# Patient Record
Sex: Female | Born: 1980 | Race: White | Hispanic: No | Marital: Single | Smoking: Former smoker
Health system: Southern US, Community
[De-identification: ages and names within clinical notes are randomized; demographics above are authoritative.]

## PROBLEM LIST (undated history)

## (undated) DIAGNOSIS — F909 Attention-deficit hyperactivity disorder, unspecified type: Secondary | ICD-10-CM

## (undated) DIAGNOSIS — Z95828 Presence of other vascular implants and grafts: Secondary | ICD-10-CM

## (undated) DIAGNOSIS — I38 Endocarditis, valve unspecified: Secondary | ICD-10-CM

---

## 2013-10-06 ENCOUNTER — Ambulatory Visit: Payer: Self-pay | Admitting: Family Medicine

## 2013-11-07 ENCOUNTER — Ambulatory Visit: Payer: Self-pay | Admitting: Internal Medicine

## 2014-01-01 ENCOUNTER — Emergency Department: Payer: Self-pay | Admitting: Emergency Medicine

## 2014-02-11 ENCOUNTER — Ambulatory Visit: Payer: Self-pay | Admitting: Physician Assistant

## 2014-04-05 ENCOUNTER — Ambulatory Visit: Payer: Self-pay | Admitting: Physician Assistant

## 2014-04-05 LAB — RAPID STREP-A WITH REFLX: Micro Text Report: POSITIVE

## 2014-04-15 ENCOUNTER — Ambulatory Visit: Payer: Self-pay | Admitting: Physician Assistant

## 2014-07-01 ENCOUNTER — Ambulatory Visit
Admit: 2014-07-01 | Disposition: A | Discharge status: Home / Self Care | Payer: Self-pay | Attending: Family Medicine | Admitting: Family Medicine

## 2015-05-08 ENCOUNTER — Ambulatory Visit
Admission: EM | Admit: 2015-05-08 | Discharge: 2015-05-08 | Disposition: A | Discharge status: Home / Self Care | Payer: Medicaid Other | Attending: Family Medicine | Admitting: Family Medicine

## 2015-05-08 DIAGNOSIS — J069 Acute upper respiratory infection, unspecified: Secondary | ICD-10-CM | POA: Diagnosis not present

## 2015-05-08 DIAGNOSIS — B349 Viral infection, unspecified: Secondary | ICD-10-CM

## 2015-05-08 DIAGNOSIS — J9801 Acute bronchospasm: Secondary | ICD-10-CM

## 2015-05-08 DIAGNOSIS — J0110 Acute frontal sinusitis, unspecified: Secondary | ICD-10-CM | POA: Diagnosis not present

## 2015-05-08 DIAGNOSIS — B9789 Other viral agents as the cause of diseases classified elsewhere: Secondary | ICD-10-CM

## 2015-05-08 HISTORY — DX: Attention-deficit hyperactivity disorder, unspecified type: F90.9

## 2015-05-08 MED ORDER — AMOXICILLIN-POT CLAVULANATE 875-125 MG PO TABS: 1.0000 | ORAL_TABLET | Freq: Two times a day (BID) | ORAL | Status: DC

## 2015-05-08 MED ORDER — ALBUTEROL SULFATE HFA 108 (90 BASE) MCG/ACT IN AERS: 2.0000 | INHALATION_SPRAY | RESPIRATORY_TRACT | Status: DC | PRN

## 2015-05-08 NOTE — ED Notes (Signed)
Silver colored ring with clear stones left in patient room. Attempted to call patient but telephone number obtained on arrival to [facility] did not work. Ring given to [employer] and he placed ring in safe

## 2015-05-08 NOTE — ED Provider Notes (Signed)
CSN: [id number]     Arrival date & time 05/08/15  1915 History   First MD Initiated Contact with Patient 05/08/15 1946     No chief complaint on file.  (Consider location/radiation/quality/duration/timing/severity/associated sxs/prior Treatment) HPI   Reports symptoms started about 2 weeks ago with allergy symptoms with watery eyes and clear rhinorrhea with mild cough. Gradual improvement over a week with OTC cetirizine. She thought that she caught a respiratory illness from her daughter. Describes worsening of symptoms about 4 days ago (after previously improved), symptoms with cough (productive with thick white mucus) and some wheezing. Unable to get in to see PCP today due to no sick visits available. - Admits to generalized body aches, primarily in chest wall / ribcage, worse following coughing - Admits to occasional headaches with sinus congestion and pressure, sinus congestion - Admits difficulty sleeping due to cough - Tried Tylenol and Advil for some sore throat and aches - Tolerating food and drink well, trying to stay hydrated - Received flu shot this season - Denies fevers/chills, ear pain, sore throat, nausea, vomiting, abdominal pain, chest pain or pressure, shortness of breath  No prior diagnosis of asthma. Usually endorses wheezing when gets sick. Also has second hand smoke exposure. Patient is former smoker quit >1 year ago  Past Medical History  Diagnosis Date  . ADHD (attention deficit hyperactivity disorder)    Past Surgical History  Procedure Laterality Date  . Cesarean section     History reviewed. Father and Sister with asthma. Social History  Substance Use Topics  . Smoking status: Former Games developer  . Smokeless tobacco: None  . Alcohol Use: No   OB History    No data available     Review of Systems  See above HPI  Allergies  Review of patient's allergies indicates no known allergies.  Home Medications   Prior to Admission medications   Medication Sig  Start Date End Date Taking? Authorizing Provider  amphetamine-dextroamphetamine (ADDERALL) 20 MG tablet Take 20 mg by mouth 3 (three) times daily.   Yes Historical Provider, MD  albuterol (PROVENTIL HFA;VENTOLIN HFA) 108 (90 Base) MCG/ACT inhaler Inhale 2 puffs into the lungs every 4 (four) hours as needed for wheezing or shortness of breath (cough). 05/08/15   Smitty Cords, DO  amoxicillin-clavulanate (AUGMENTIN) 875-125 MG tablet Take 1 tablet by mouth 2 (two) times daily. Only fill if worsening over weekend. 05/08/15   Smitty Cords, DO   Meds Ordered and Administered this Visit  Medications - No data to display  BP 113/74 mmHg  Pulse 94  Temp(Src) 98.4 F (36.9 C) (Oral)  Resp 18  SpO2 97%  LMP 04/26/2015 (Approximate) No data found.   Physical Exam  Constitutional: She is oriented to person, place, and time. She appears well-developed and well-nourished. No distress.  Well-appearing, comfortable, cooperative, conversational  HENT:  Head: Normocephalic and atraumatic.  Mouth/Throat: Oropharynx is clear and moist.  Frontal sinus tenderness bilaterally, nares patent without nasal purulence, bilateral TM's grey and clear without erythema, effusion or bulging. Oropharynx clear without erythema, edema, or asymmetry  Eyes: Conjunctivae and EOM are normal. Pupils are equal, round, and reactive to light. Right eye exhibits no discharge. Left eye exhibits no discharge.  Neck: Normal range of motion. Neck supple. No thyromegaly present.  Cardiovascular: Normal rate, regular rhythm, normal heart sounds and intact distal pulses.   No murmur heard. Pulmonary/Chest: Effort normal. No respiratory distress. She has wheezes (bilateral expiratory wheezes). She has no rales. She exhibits  no tenderness.  Good air movement. Speaks full sentences.  Musculoskeletal: She exhibits no edema or tenderness (extremities and chest wall).  Lymphadenopathy:    She has no cervical adenopathy.   Neurological: She is alert and oriented to person, place, and time.  Skin: Skin is warm and dry. No rash noted. She is not diaphoretic.  Psychiatric: She has a normal mood and affect.  Nursing note and vitals reviewed.   ED Course  Procedures (including critical care time)  Labs Review Labs Reviewed - No data to display  Imaging Review No results found.    MDM   1. Acute frontal sinusitis, recurrence not specified   2. Viral URI with cough   3. Acute bronchospasm due to viral infection    35 yr Female former smoker with constellation of viral URI symptoms x 2 weeks, initially allergic symptoms improved then worsened 4 days ago to more sinusitis vs URI with productive cough and wheezing. No formal dx asthma (similar prior course). Clinically well-appearing, afebrile, exp wheezing on exam, otherwise no focal findings of infection. Some frontal sinus tenderness but no purulence.  Suspect acute rhinosinusitis, likely initially viral URI vs allergic etiology with improvement now with persistent sinus congestion without evidence of acute bacterial infection.   Plan: 1. Reassurance, likely self-limited - however given upcoming weekend and overall >2 weeks of illness with worsening now, mutual decision to provide empiric antibiotic if patient worsens over weekend, with Augmentin 875-125mg  PO BID x 10 days only fill if worsening 2. Start Albuterol inhaler 2 puffs q 4-6 hr regularly for 2-3 days then PRN 3. Continue Zyrtec OTC daily - likely allergic component 4. Supportive care with nasal saline OTC, hydration, Mucinex-DM 5. Work note out 2/9 and 2/10, return Monday 2/13 6. Return criteria reviewed, close follow-up with PCP within 1 week if not improving despite antibiotic    Smitty Cords, DO 05/08/15 2034
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
## 2015-05-08 NOTE — ED Provider Notes (Signed)
CSN: [id number]     Arrival date & time 05/08/15  1915 History   First MD Initiated Contact with Patient 05/08/15 1946     No chief complaint on file.  (Consider location/radiation/quality/duration/timing/severity/associated sxs/prior Treatment) HPI: Reviewed history with Dr. Kirtland Bouchard. Patient does have mild cough, wheezing, sinus congestion, clear to white colored mucus. Patient denies any high fevers, chest pain, nausea, vomiting, diarrhea, abdominal pain. She has used the inhaler in the past for upper respiratory infections. She is not smoke but is a former smoker.  Past Medical History  Diagnosis Date  . ADHD (attention deficit hyperactivity disorder)    Past Surgical History  Procedure Laterality Date  . Cesarean section     History reviewed. No pertinent family history. Social History  Substance Use Topics  . Smoking status: Former Games developer  . Smokeless tobacco: None  . Alcohol Use: No   OB History    No data available     Review of Systems: Negative except mentioned above.  Allergies  Review of patient's allergies indicates no known allergies.  Home Medications   Prior to Admission medications   Medication Sig Start Date End Date Taking? Authorizing Provider  amphetamine-dextroamphetamine (ADDERALL) 20 MG tablet Take 20 mg by mouth 3 (three) times daily.   Yes Historical Provider, MD   Meds Ordered and Administered this Visit  Medications - No data to display  BP 113/74 mmHg  Pulse 94  Temp(Src) 98.4 F (36.9 C) (Oral)  Resp 18  SpO2 97%  LMP 04/26/2015 (Approximate) No data found.   Physical Exam:  GENERAL: NAD HEENT: mild pharyngeal erythema, no exudate, no erythema of TMs, no cervical LAD RESP: mild expiratory wheezes, no retractions or accessory muscle use  CARD: RRR NEURO: CN II-XII grossly intact   ED Course  Procedures (including critical care time)  Labs Review Labs Reviewed - No data to display  Imaging Review No results found.       MDM   A/P: URI/Sinusitis-did review assessment and plan with Dr. Kirtland Bouchard. I agree with his plan. Patient will start antibiotic a few days if needed. She is to use her Albuterol Inhaler. She is to use other over-the-counter medications to treat her symptoms. If her symptoms do persist or worsen I do recommend that she seek medical attention as discussed. Patient was given a work excuse for 2 days.   Jolene Provost, MD 05/08/15 2023

## 2015-05-08 NOTE — ED Notes (Signed)
Non-toxic appearing pt.  Wearing mask upon arrival to treatment room.

## 2015-05-08 NOTE — Discharge Instructions (Signed)
1. It sounds like you have persistent Sinus Congestion or "Rhinosinusitis" - I do not think that this is a Bacterial Sinus Infection. Usually these are caused by Viruses or Allergies, and will run it's course in about 7 to 10 days. - We do not think you need to start an antibiotic today, however, if your symptoms don't improve it may be a bacterial infection, if worsening over weekend, start Augmentin 1 pill twice daily for total 10 days - Recommend to start keep using Nasal Saline spray multiple times a day to help flush out congestion and clear sinuses - Improve hydration by drinking plenty of clear fluids (water, gatorade) to reduce secretions and thin congestion - You are wheezing, recommend using Albuterol inhaler 2 puffs every 4 to 6 hours (use regularly for next 2-3 days then switch to ONLY AS NEEDED) - Continue Zyrtec (once daily) over the counter for about 1 month trial - if helping can continue or resume during allergy season - Congestion draining down throat can cause irritation. May try warm herbal tea with honey, cough drops - Can take Tylenol or Ibuprofen as needed for fevers - May try Mucinex-DM for cough twice daily for up to 5 to 7 days, then stop  If you develop persistent fever >101F for at least 3 consecutive days, headaches with sinus pain or pressure or persistent earache, recommend start antibiotic, and follow-up with your primary doctor within a week
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
## 2015-12-28 ENCOUNTER — Emergency Department: Payer: No Typology Code available for payment source

## 2015-12-28 ENCOUNTER — Emergency Department
Admission: EM | Admit: 2015-12-28 | Discharge: 2015-12-28 | Disposition: A | Discharge status: Home / Self Care | Payer: No Typology Code available for payment source | Attending: Emergency Medicine | Admitting: Emergency Medicine

## 2015-12-28 DIAGNOSIS — Z79899 Other long term (current) drug therapy: Secondary | ICD-10-CM | POA: Insufficient documentation

## 2015-12-28 DIAGNOSIS — J189 Pneumonia, unspecified organism: Secondary | ICD-10-CM | POA: Diagnosis not present

## 2015-12-28 DIAGNOSIS — S27329A Contusion of lung, unspecified, initial encounter: Secondary | ICD-10-CM

## 2015-12-28 DIAGNOSIS — Y999 Unspecified external cause status: Secondary | ICD-10-CM | POA: Diagnosis not present

## 2015-12-28 DIAGNOSIS — N390 Urinary tract infection, site not specified: Secondary | ICD-10-CM | POA: Insufficient documentation

## 2015-12-28 DIAGNOSIS — Y92410 Unspecified street and highway as the place of occurrence of the external cause: Secondary | ICD-10-CM | POA: Insufficient documentation

## 2015-12-28 DIAGNOSIS — Y9389 Activity, other specified: Secondary | ICD-10-CM | POA: Insufficient documentation

## 2015-12-28 DIAGNOSIS — F909 Attention-deficit hyperactivity disorder, unspecified type: Secondary | ICD-10-CM | POA: Insufficient documentation

## 2015-12-28 DIAGNOSIS — Z87891 Personal history of nicotine dependence: Secondary | ICD-10-CM | POA: Insufficient documentation

## 2015-12-28 DIAGNOSIS — S299XXA Unspecified injury of thorax, initial encounter: Secondary | ICD-10-CM | POA: Diagnosis present

## 2015-12-28 LAB — URINALYSIS COMPLETE WITH MICROSCOPIC (ARMC ONLY)
Bilirubin Urine: NEGATIVE
GLUCOSE, UA: NEGATIVE mg/dL
Ketones, ur: NEGATIVE mg/dL
Nitrite: POSITIVE — AB
PROTEIN: 100 mg/dL — AB
SPECIFIC GRAVITY, URINE: 1.013 (ref 1.005–1.030)
pH: 5 (ref 5.0–8.0)

## 2015-12-28 LAB — COMPREHENSIVE METABOLIC PANEL
ALBUMIN: 3 g/dL — AB (ref 3.5–5.0)
ALK PHOS: 224 U/L — AB (ref 38–126)
ALT: 39 U/L (ref 14–54)
AST: 41 U/L (ref 15–41)
Anion gap: 10 (ref 5–15)
BUN: 21 mg/dL — ABNORMAL HIGH (ref 6–20)
CALCIUM: 8.8 mg/dL — AB (ref 8.9–10.3)
CHLORIDE: 96 mmol/L — AB (ref 101–111)
CO2: 25 mmol/L (ref 22–32)
CREATININE: 1.09 mg/dL — AB (ref 0.44–1.00)
GFR calc Af Amer: >60 mL/min (ref >60)
GFR calc non Af Amer: >60 mL/min (ref >60)
GLUCOSE: 155 mg/dL — AB (ref 65–99)
Potassium: 2.7 mmol/L — CL (ref 3.5–5.1)
SODIUM: 131 mmol/L — AB (ref 135–145)
Total Bilirubin: 1.5 mg/dL — ABNORMAL HIGH (ref 0.3–1.2)
Total Protein: 7.1 g/dL (ref 6.5–8.1)

## 2015-12-28 LAB — CBC WITH DIFFERENTIAL/PLATELET
BASOS ABS: 0 10*3/uL (ref 0–0.1)
BASOS PCT: 0 %
EOS ABS: 0 10*3/uL (ref 0–0.7)
Eosinophils Relative: 0 %
HCT: 35.6 % (ref 35.0–47.0)
HEMOGLOBIN: 12.1 g/dL (ref 12.0–16.0)
Lymphocytes Relative: 7 %
Lymphs Abs: 1.2 10*3/uL (ref 1.0–3.6)
MCH: 28.7 pg (ref 26.0–34.0)
MCHC: 34.1 g/dL (ref 32.0–36.0)
MCV: 84.2 fL (ref 80.0–100.0)
Monocytes Absolute: 0.7 10*3/uL (ref 0.2–0.9)
Monocytes Relative: 4 %
NEUTROS PCT: 89 %
Neutro Abs: 16 10*3/uL — ABNORMAL HIGH (ref 1.4–6.5)
PLATELETS: 125 10*3/uL — AB (ref 150–440)
RBC: 4.22 MIL/uL (ref 3.80–5.20)
RDW: 12.4 % (ref 11.5–14.5)
WBC: 17.9 10*3/uL — AB (ref 3.6–11.0)

## 2015-12-28 LAB — LACTIC ACID, PLASMA: LACTIC ACID, VENOUS: 1.3 mmol/L (ref 0.5–1.9)

## 2015-12-28 LAB — TROPONIN I

## 2015-12-28 LAB — POCT PREGNANCY, URINE: Preg Test, Ur: NEGATIVE

## 2015-12-28 MED ORDER — LEVOFLOXACIN 750 MG PO TABS: 750.0000 mg | ORAL_TABLET | Freq: Every day | ORAL | 0 refills | Status: DC

## 2015-12-28 MED ORDER — LEVOFLOXACIN IN D5W 750 MG/150ML IV SOLN
750.0000 mg | Freq: Once | INTRAVENOUS | Status: AC
  Administered 2015-12-28: 750 mg via INTRAVENOUS
  Filled 2015-12-28: qty 150

## 2015-12-28 MED ORDER — POTASSIUM CHLORIDE CRYS ER 20 MEQ PO TBCR
40.0000 meq | EXTENDED_RELEASE_TABLET | Freq: Once | ORAL | Status: AC
  Administered 2015-12-28: 40 meq via ORAL
  Filled 2015-12-28 (×2): qty 2

## 2015-12-28 MED ORDER — ACETAMINOPHEN 325 MG PO TABS
650.0000 mg | ORAL_TABLET | Freq: Once | ORAL | Status: AC
  Administered 2015-12-28: 650 mg via ORAL

## 2015-12-28 MED ORDER — SODIUM CHLORIDE 0.9 % IV SOLN
1000.0000 mL | Freq: Once | INTRAVENOUS | Status: AC
  Administered 2015-12-28: 1000 mL via INTRAVENOUS

## 2015-12-28 MED ORDER — ACETAMINOPHEN 325 MG PO TABS
ORAL_TABLET | ORAL | Status: AC
  Filled 2015-12-28: qty 2

## 2015-12-28 NOTE — ED Triage Notes (Signed)
States just got of jail for probation violation so this is first chance to get checked out
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
## 2015-12-29 ENCOUNTER — Encounter: Payer: Self-pay | Admitting: Emergency Medicine

## 2015-12-29 ENCOUNTER — Inpatient Hospital Stay
Admission: EM | Admit: 2015-12-29 | Discharge: 2016-01-02 | DRG: 871 | Disposition: A | Discharge status: Home Health | Payer: Medicaid Other | Attending: Internal Medicine | Admitting: Internal Medicine

## 2015-12-29 ENCOUNTER — Inpatient Hospital Stay (HOSPITAL_COMMUNITY)
Admit: 2015-12-29 | Discharge: 2015-12-29 | Disposition: A | Discharge status: Home / Self Care | Payer: Medicaid Other | Attending: Internal Medicine | Admitting: Internal Medicine

## 2015-12-29 DIAGNOSIS — A4101 Sepsis due to Methicillin susceptible Staphylococcus aureus: Principal | ICD-10-CM | POA: Diagnosis present

## 2015-12-29 DIAGNOSIS — N3000 Acute cystitis without hematuria: Secondary | ICD-10-CM | POA: Diagnosis present

## 2015-12-29 DIAGNOSIS — I330 Acute and subacute infective endocarditis: Secondary | ICD-10-CM | POA: Diagnosis present

## 2015-12-29 DIAGNOSIS — R7881 Bacteremia: Secondary | ICD-10-CM

## 2015-12-29 DIAGNOSIS — L89152 Pressure ulcer of sacral region, stage 2: Secondary | ICD-10-CM | POA: Diagnosis present

## 2015-12-29 DIAGNOSIS — F909 Attention-deficit hyperactivity disorder, unspecified type: Secondary | ICD-10-CM | POA: Diagnosis present

## 2015-12-29 DIAGNOSIS — G8929 Other chronic pain: Secondary | ICD-10-CM | POA: Diagnosis present

## 2015-12-29 DIAGNOSIS — J189 Pneumonia, unspecified organism: Secondary | ICD-10-CM | POA: Diagnosis present

## 2015-12-29 DIAGNOSIS — Z87891 Personal history of nicotine dependence: Secondary | ICD-10-CM

## 2015-12-29 DIAGNOSIS — E876 Hypokalemia: Secondary | ICD-10-CM | POA: Diagnosis present

## 2015-12-29 DIAGNOSIS — A419 Sepsis, unspecified organism: Secondary | ICD-10-CM | POA: Diagnosis present

## 2015-12-29 DIAGNOSIS — Z79899 Other long term (current) drug therapy: Secondary | ICD-10-CM | POA: Diagnosis not present

## 2015-12-29 DIAGNOSIS — Z452 Encounter for adjustment and management of vascular access device: Secondary | ICD-10-CM

## 2015-12-29 DIAGNOSIS — M869 Osteomyelitis, unspecified: Secondary | ICD-10-CM | POA: Diagnosis present

## 2015-12-29 DIAGNOSIS — M898X1 Other specified disorders of bone, shoulder: Secondary | ICD-10-CM

## 2015-12-29 DIAGNOSIS — I2690 Septic pulmonary embolism without acute cor pulmonale: Secondary | ICD-10-CM | POA: Diagnosis present

## 2015-12-29 DIAGNOSIS — R0789 Other chest pain: Secondary | ICD-10-CM

## 2015-12-29 DIAGNOSIS — Z825 Family history of asthma and other chronic lower respiratory diseases: Secondary | ICD-10-CM | POA: Diagnosis not present

## 2015-12-29 DIAGNOSIS — E860 Dehydration: Secondary | ICD-10-CM | POA: Diagnosis present

## 2015-12-29 DIAGNOSIS — L8990 Pressure ulcer of unspecified site, unspecified stage: Secondary | ICD-10-CM | POA: Insufficient documentation

## 2015-12-29 DIAGNOSIS — R079 Chest pain, unspecified: Secondary | ICD-10-CM

## 2015-12-29 LAB — ECHOCARDIOGRAM COMPLETE
HEIGHTINCHES: 64 in
WEIGHTICAEL: 2720 [oz_av]

## 2015-12-29 LAB — CBC WITH DIFFERENTIAL/PLATELET
BASOS PCT: 1 %
Basophils Absolute: 0.1 10*3/uL (ref 0–0.1)
EOS ABS: 0.3 10*3/uL (ref 0–0.7)
EOS PCT: 2 %
HCT: 32.7 % — ABNORMAL LOW (ref 35.0–47.0)
HEMOGLOBIN: 11 g/dL — AB (ref 12.0–16.0)
LYMPHS ABS: 2 10*3/uL (ref 1.0–3.6)
Lymphocytes Relative: 12 %
MCH: 28.7 pg (ref 26.0–34.0)
MCHC: 33.6 g/dL (ref 32.0–36.0)
MCV: 85.4 fL (ref 80.0–100.0)
MONO ABS: 0.9 10*3/uL (ref 0.2–0.9)
MONOS PCT: 6 %
NEUTROS PCT: 79 %
Neutro Abs: 13.3 10*3/uL — ABNORMAL HIGH (ref 1.4–6.5)
Platelets: 154 10*3/uL (ref 150–440)
RBC: 3.83 MIL/uL (ref 3.80–5.20)
RDW: 12.4 % (ref 11.5–14.5)
WBC: 16.6 10*3/uL — ABNORMAL HIGH (ref 3.6–11.0)

## 2015-12-29 LAB — BLOOD CULTURE ID PANEL (REFLEXED)
ACINETOBACTER BAUMANNII: NOT DETECTED
CANDIDA ALBICANS: NOT DETECTED
CANDIDA GLABRATA: NOT DETECTED
CANDIDA KRUSEI: NOT DETECTED
CANDIDA PARAPSILOSIS: NOT DETECTED
Candida tropicalis: NOT DETECTED
ESCHERICHIA COLI: NOT DETECTED
Enterobacter cloacae complex: NOT DETECTED
Enterobacteriaceae species: NOT DETECTED
Enterococcus species: NOT DETECTED
Haemophilus influenzae: NOT DETECTED
KLEBSIELLA OXYTOCA: NOT DETECTED
Klebsiella pneumoniae: NOT DETECTED
Listeria monocytogenes: NOT DETECTED
Methicillin resistance: NOT DETECTED
Neisseria meningitidis: NOT DETECTED
PSEUDOMONAS AERUGINOSA: NOT DETECTED
Proteus species: NOT DETECTED
SERRATIA MARCESCENS: NOT DETECTED
STAPHYLOCOCCUS AUREUS BCID: DETECTED — AB
STREPTOCOCCUS PNEUMONIAE: NOT DETECTED
STREPTOCOCCUS PYOGENES: NOT DETECTED
Staphylococcus species: DETECTED — AB
Streptococcus agalactiae: NOT DETECTED
Streptococcus species: NOT DETECTED

## 2015-12-29 LAB — COMPREHENSIVE METABOLIC PANEL
ALBUMIN: 2.5 g/dL — AB (ref 3.5–5.0)
ALK PHOS: 180 U/L — AB (ref 38–126)
ALT: 28 U/L (ref 14–54)
ANION GAP: 12 (ref 5–15)
AST: 24 U/L (ref 15–41)
BUN: 24 mg/dL — ABNORMAL HIGH (ref 6–20)
CALCIUM: 8.6 mg/dL — AB (ref 8.9–10.3)
CO2: 22 mmol/L (ref 22–32)
Chloride: 99 mmol/L — ABNORMAL LOW (ref 101–111)
Creatinine, Ser: 1.04 mg/dL — ABNORMAL HIGH (ref 0.44–1.00)
GFR calc non Af Amer: >60 mL/min (ref >60)
Glucose, Bld: 103 mg/dL — ABNORMAL HIGH (ref 65–99)
POTASSIUM: 3.1 mmol/L — AB (ref 3.5–5.1)
SODIUM: 133 mmol/L — AB (ref 135–145)
TOTAL PROTEIN: 6.3 g/dL — AB (ref 6.5–8.1)
Total Bilirubin: 1.5 mg/dL — ABNORMAL HIGH (ref 0.3–1.2)

## 2015-12-29 LAB — MRSA PCR SCREENING: MRSA by PCR: NEGATIVE

## 2015-12-29 LAB — MAGNESIUM: Magnesium: 2.4 mg/dL (ref 1.7–2.4)

## 2015-12-29 MED ORDER — DEXTROSE 5 % IV SOLN
500.0000 mg | Freq: Once | INTRAVENOUS | Status: AC
  Administered 2015-12-29: 500 mg via INTRAVENOUS
  Filled 2015-12-29: qty 500

## 2015-12-29 MED ORDER — IBUPROFEN 600 MG PO TABS
600.0000 mg | ORAL_TABLET | Freq: Four times a day (QID) | ORAL | Status: DC | PRN
  Administered 2015-12-29 – 2016-01-02 (×13): 600 mg via ORAL
  Filled 2015-12-29 (×14): qty 1

## 2015-12-29 MED ORDER — ACETAMINOPHEN 325 MG PO TABS
650.0000 mg | ORAL_TABLET | Freq: Four times a day (QID) | ORAL | Status: DC | PRN
Start: 2015-12-29 — End: 2016-01-02
  Administered 2015-12-29 – 2016-01-02 (×14): 650 mg via ORAL
  Filled 2015-12-29 (×14): qty 2

## 2015-12-29 MED ORDER — ACETAMINOPHEN 650 MG RE SUPP: 650.0000 mg | Freq: Four times a day (QID) | RECTAL | Status: DC | PRN

## 2015-12-29 MED ORDER — SODIUM CHLORIDE 0.9 % IV BOLUS (SEPSIS)
1000.0000 mL | Freq: Once | INTRAVENOUS | Status: AC
  Administered 2015-12-29: 1000 mL via INTRAVENOUS

## 2015-12-29 MED ORDER — CEFAZOLIN IN D5W 1 GM/50ML IV SOLN
1.0000 g | Freq: Three times a day (TID) | INTRAVENOUS | Status: DC
  Administered 2015-12-30 (×2): 1 g via INTRAVENOUS
  Filled 2015-12-29 (×3): qty 50

## 2015-12-29 MED ORDER — ALBUTEROL SULFATE (2.5 MG/3ML) 0.083% IN NEBU: 3.0000 mL | INHALATION_SOLUTION | RESPIRATORY_TRACT | Status: DC | PRN

## 2015-12-29 MED ORDER — ENOXAPARIN SODIUM 40 MG/0.4ML ~~LOC~~ SOLN
40.0000 mg | SUBCUTANEOUS | Status: DC
  Administered 2015-12-29 – 2016-01-01 (×4): 40 mg via SUBCUTANEOUS
  Filled 2015-12-29 (×4): qty 0.4

## 2015-12-29 MED ORDER — SODIUM CHLORIDE 0.9% FLUSH
3.0000 mL | Freq: Two times a day (BID) | INTRAVENOUS | Status: DC
  Administered 2015-12-29 – 2016-01-02 (×9): 3 mL via INTRAVENOUS

## 2015-12-29 MED ORDER — VANCOMYCIN HCL IN DEXTROSE 1-5 GM/200ML-% IV SOLN
1000.0000 mg | Freq: Once | INTRAVENOUS | Status: AC
  Administered 2015-12-29: 1000 mg via INTRAVENOUS
  Filled 2015-12-29: qty 200

## 2015-12-29 MED ORDER — POTASSIUM CHLORIDE CRYS ER 20 MEQ PO TBCR
40.0000 meq | EXTENDED_RELEASE_TABLET | Freq: Once | ORAL | Status: AC
  Administered 2015-12-29: 40 meq via ORAL
  Filled 2015-12-29: qty 2

## 2015-12-29 MED ORDER — DEXTROSE 5 % IV SOLN
1.0000 g | Freq: Once | INTRAVENOUS | Status: AC
  Administered 2015-12-29: 1 g via INTRAVENOUS
  Filled 2015-12-29: qty 10

## 2015-12-29 NOTE — ED Notes (Signed)
Patient denies pain at IV site, but veins appear more prominent and reddened. Zithromax infusion has been stopped and NS is infusing at this time. Dr. Inocencio HomesGayle aware.

## 2015-12-29 NOTE — ED Triage Notes (Signed)
Pt was seen here yesterday and was discharged to home. Called by this am and was told to return for positive culture results and need IV antibiotics.
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
## 2015-12-29 NOTE — ED Provider Notes (Signed)
-----------------------------------------   4:32 AM on 12/29/2015 -----------------------------------------  Nate from pharmacy called me to let me know that both blood cultures obtained yesterday were positive for staph aureus.  I reviewed Dr. Fransisca ConnorsKinner's note and advised Nate to call the patient tonight and tell her to come back to the emergency department given the probability of bacteremia/septicemia.  He will do so at this time.   Loleta Roseory Lissa Rowles, MD 12/29/15 [phone]
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
## 2015-12-29 NOTE — ED Notes (Signed)
Pt given cup of ice. 

## 2015-12-29 NOTE — Progress Notes (Addendum)
Pharmacy Antibiotic Follow-up Note  Carmen Riley is a 35 y.o. year-old female admitted on 12/28/2015.   Assessment/Plan: Lab called to report GPC in aerobic and anaerobic bottles of 2 sets (4 bottle positive) staph species staphylococcus aureus mecA not detected. Patient was discharged from the ED this afternoon on oral levofloxacin. Spoke with Dr. York CeriseForbach and we decided to reach out to patient now to make her aware of positive blood cultures and to recommend she return to ED for IV treatment. I called the phone numbers in the chart [phone] no answer and not set up to accept voicemails) and ([phone] went straight to voicemail x 2). Left message for her to call back [phone] to speak with me. We will continue to try to reach her.  10/2 0602 additional calls made to home and cell phone. No answer, no message left. Will continue to try to reach her.   10/2 0655 Spoke to [name] and asked her to come back to ED. She had some concerns about child care, but said she would drop her daughter off at 0730 and make her way back to the ED.   Temp (24hrs), Avg:98.1 F (36.7 C), Min:98.1 F (36.7 C), Max:98.1 F (36.7 C)   Recent Labs Lab 12/28/15 1448  WBC 17.9*    Recent Labs Lab 12/28/15 1448  CREATININE 1.09*   CrCl cannot be calculated (Unknown ideal weight.).    No Known Allergies  Thank you for allowing pharmacy to be a part of this patient's care.  Carola FrostNathan A Honesti Seaberg, Pharm.D., BCPS Clinical Pharmacist 12/29/2015 4:36 AM

## 2015-12-30 ENCOUNTER — Inpatient Hospital Stay: Payer: Medicaid Other

## 2015-12-30 DIAGNOSIS — L8990 Pressure ulcer of unspecified site, unspecified stage: Secondary | ICD-10-CM | POA: Insufficient documentation

## 2015-12-30 LAB — SEDIMENTATION RATE: SED RATE: 78 mm/h — AB (ref 0–20)

## 2015-12-30 LAB — BASIC METABOLIC PANEL
Anion gap: 5 (ref 5–15)
BUN: 14 mg/dL (ref 6–20)
CALCIUM: 8.5 mg/dL — AB (ref 8.9–10.3)
CO2: 27 mmol/L (ref 22–32)
Chloride: 106 mmol/L (ref 101–111)
Creatinine, Ser: 0.69 mg/dL (ref 0.44–1.00)
GFR calc Af Amer: >60 mL/min (ref >60)
GLUCOSE: 143 mg/dL — AB (ref 65–99)
Potassium: 3.3 mmol/L — ABNORMAL LOW (ref 3.5–5.1)
Sodium: 138 mmol/L (ref 135–145)

## 2015-12-30 LAB — CBC
HCT: 32.4 % — ABNORMAL LOW (ref 35.0–47.0)
Hemoglobin: 11.2 g/dL — ABNORMAL LOW (ref 12.0–16.0)
MCH: 29.4 pg (ref 26.0–34.0)
MCHC: 34.7 g/dL (ref 32.0–36.0)
MCV: 84.7 fL (ref 80.0–100.0)
Platelets: 194 10*3/uL (ref 150–440)
RBC: 3.82 MIL/uL (ref 3.80–5.20)
RDW: 12.8 % (ref 11.5–14.5)
WBC: 14 10*3/uL — ABNORMAL HIGH (ref 3.6–11.0)

## 2015-12-30 LAB — RAPID HIV SCREEN (HIV 1/2 AB+AG)
HIV 1/2 Antibodies: NONREACTIVE
HIV-1 P24 ANTIGEN - HIV24: NONREACTIVE

## 2015-12-30 LAB — C-REACTIVE PROTEIN: CRP: 32.3 mg/dL — AB (ref <1.0)

## 2015-12-30 MED ORDER — CEFAZOLIN SODIUM-DEXTROSE 2-4 GM/100ML-% IV SOLN
2.0000 g | Freq: Three times a day (TID) | INTRAVENOUS | Status: DC
  Administered 2015-12-30 – 2016-01-02 (×10): 2 g via INTRAVENOUS
  Filled 2015-12-30 (×13): qty 100

## 2015-12-30 MED ORDER — BUPRENORPHINE HCL 2 MG SL SUBL
2.0000 mg | SUBLINGUAL_TABLET | Freq: Every day | SUBLINGUAL | Status: DC
  Administered 2015-12-30 – 2016-01-02 (×4): 2 mg via SUBLINGUAL
  Filled 2015-12-30 (×4): qty 1

## 2015-12-30 MED ORDER — IOPAMIDOL (ISOVUE-370) INJECTION 76%
75.0000 mL | Freq: Once | INTRAVENOUS | Status: AC | PRN
  Administered 2015-12-30: 75 mL via INTRAVENOUS
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
## 2015-12-31 LAB — CULTURE, BLOOD (ROUTINE X 2)

## 2015-12-31 LAB — HEPATITIS C ANTIBODY

## 2015-12-31 NOTE — Progress Notes (Signed)
[facility] CLINIC INFECTIOUS DISEASE PROGRESS NOTE Date of Admission:  12/29/2015     ID: Carmen Riley is a 35 y.o. female with  MSSA bacteremia, septic pulm emboli and probable osteo of sternum Active Problems:   Sepsis (HCC)   Pressure injury of skin   Subjective: Low grade temp, still with chest pain but improving, min dry cough   ROS  Eleven systems are reviewed and negative except per hpi  Medications:  Antibiotics Given (last 72 hours)    Date/Time Action Medication Dose Rate   12/30/15 0603 Given   ceFAZolin (ANCEF) IVPB 1 g/50 mL premix 1 g 100 mL/hr   12/30/15 1329 Given   ceFAZolin (ANCEF) IVPB 1 g/50 mL premix 1 g 100 mL/hr   12/30/15 2031 Given   ceFAZolin (ANCEF) IVPB 2g/100 mL premix 2 g 200 mL/hr   12/31/15 0502 Given   ceFAZolin (ANCEF) IVPB 2g/100 mL premix 2 g 200 mL/hr     . buprenorphine  2 mg Sublingual Daily  .  ceFAZolin (ANCEF) IV  2 g Intravenous Q8H  . enoxaparin (LOVENOX) injection  40 mg Subcutaneous Q24H  . sodium chloride flush  3 mL Intravenous Q12H    Objective: Vital signs in last 24 hours: Temp:  [98.3 F (36.8 C)-100.3 F (37.9 C)] 98.3 F (36.8 C) (10/04 1200) Pulse Rate:  [91-114] 95 (10/04 1422) Resp:  [16-18] 18 (10/04 1422) BP: (112-123)/(66-76) 117/66 (10/04 1422) SpO2:  [94 %-99 %] 97 % (10/04 1422) Constitutional:  oriented to person, place, and time. appears in pain HENT: [place]/AT, PERRLA, no scleral icterus Mouth/Throat: Oropharynx is clear and moist. No oropharyngeal exudate.  Cardiovascular: Normal rate, regular rhythm and normal heart sounds. Pulmonary/Chest painful resp, nml air movement but mild rhonchi, .  TTP over sternm and clavicle  Neck = supple, no nuchal rigidity Abdominal: Soft. Bowel sounds are normal.  exhibits no distension. There is no tenderness.  Lymphadenopathy: no cervical adenopathy. No axillary adenopathy Neurological: alert and oriented to person, place, and time.  Skin: Skin is warm and dry.  No rash noted. No erythema. No stigmata of endocarditis  Psychiatric: a normal mood and affect.  behavior is normal.    Lab Results  Recent Labs  12/29/15 1123 12/30/15 0524 12/30/15 0645  WBC 16.6*  --  14.0*  HGB 11.0*  --  11.2*  HCT 32.7*  --  32.4*  NA 133* 138  --   K 3.1* 3.3*  --   CL 99* 106  --   CO2 22 27  --   BUN 24* 14  --   CREATININE 1.04* 0.69  --     Microbiology: Results for orders placed or performed during the hospital encounter of 12/29/15  MRSA PCR Screening     Status: None   Collection Time: 12/29/15  5:23 PM  Result Value Ref Range Status   MRSA by PCR NEGATIVE NEGATIVE Final    Comment:        The GeneXpert MRSA Assay (FDA approved for NASAL specimens only), is one component of a comprehensive MRSA colonization surveillance program. It is not intended to diagnose MRSA infection nor to guide or monitor treatment for MRSA infections.    Specimen Description BLOOD LEFT HAND   Special Requests BOTTLES DRAWN AEROBIC AND ANAEROBIC 5CC   Culture Setup Time GRAM POSITIVE COCCI  IN BOTH AEROBIC AND ANAEROBIC BOTTLES  CRITICAL RESULT CALLED TO, READ BACK BY AND VERIFIED WITH: NATE COOKSON AT 1610 12/29/15.PMH  CONFIRMED BY  RWW  Performed at [facility]       Culture STAPHYLOCOCCUS AUREUS    Report Status 12/31/2015 FINAL   Organism ID, Bacteria STAPHYLOCOCCUS AUREUS   Resulting Agency [facility]  Susceptibility    Staphylococcus aureus    MIC    CIPROFLOXACIN <=0.5 SENSITIVE "><=0.5 SENSI... Sensitive    CLINDAMYCIN <=0.25 SENSITIVE "><=0.25 SENS... Sensitive    ERYTHROMYCIN <=0.25 SENSITIVE "><=0.25 SENS... Sensitive    GENTAMICIN <=0.5 SENSITIVE "><=0.5 SENSI... Sensitive    Inducible Clindamycin NEGATIVE  Sensitive    OXACILLIN 0.5 SENSITIVE  Sensitive    RIFAMPIN <=0.5 SENSITIVE "><=0.5 SENSI... Sensitive    TETRACYCLINE <=1 SENSITIVE "><=1 SENSITIVE  Sensitive    TRIMETH/SULFA <=10 SENSITIVE "><=10 SENSIT... Sensitive      VANCOMYCIN <=0.5 SENSITIVE "><=0.5 SENSI... Sensitive       Studies/Results: Ct Angio Chest Pe W Or Wo Contrast  Addendum Date: 12/30/2015   ADDENDUM REPORT: 12/30/2015 18:37 ADDENDUM: These results were called by telephone at the time of interpretation on 12/30/2015 at 6:36 pm to Dr. Elisabeth Pigeon, who verbally acknowledged these results. Electronically Signed   By: Mitzi Hansen M.D.   On: 12/30/2015 18:37   Result Date: 12/30/2015 CLINICAL DATA:  35 y/o F; upper chest pain worsening over several days with some difficulty breathing. Staph aureus bacteremia. Possible osteomyelitis of the clavicle. EXAM: CT ANGIOGRAPHY CHEST WITH CONTRAST TECHNIQUE: Multidetector CT imaging of the chest was performed using the standard protocol during bolus administration of intravenous contrast. Multiplanar CT image reconstructions and MIPs were obtained to evaluate the vascular anatomy. CONTRAST:  75 cc Isovue 370. COMPARISON:  None. FINDINGS: Cardiovascular: Satisfactory opacification of the pulmonary arteries to the segmental level. No evidence of pulmonary embolism. Normal heart size. No pericardial effusion. Mediastinum/Nodes: There is fat stranding within the upper anterior mediastinum an soft tissue thickening subjacent to the sternum as well as stranding of subcutaneous fat within the lower anterior neck and upper chest. No definite osseous erosions of the clavicles or sternoclavicular joints is identified. Lungs/Pleura: There are numerous necrotic pulmonary nodules throughout the lungs bilaterally. Small bilateral pleural effusions. Upper Abdomen: Splenomegaly. Musculoskeletal: No osseous erosion. Review of the MIP images confirms the above findings. IMPRESSION: 1. Multiple necrotic nodules throughout the lungs consistent with septic emboli. Small bilateral pleural effusions. 2. Soft tissue thickening anterior and posterior to the manubrium and upper sternum and fat stranding within the subcutaneous fat of  the anterior lower neck and upper chest and within the anterior upper mediastinum likely represents infectious process possibly centered in the manubrium or a sternoclavicular joints. No discrete osseous erosion or fluid collection is identified. 3. No pulmonary embolus. Electronically Signed: By: Mitzi Hansen M.D. On: 12/30/2015 18:11   Echo  - Left ventricle: The cavity size was normal. Systolic function was   normal. The estimated ejection fraction was in the range of 60%   to 65%. Wall motion was normal; there were no regional wall   motion abnormalities. - Left atrium: The atrium was normal in size. - Right ventricle: Systolic function was normal. - Pulmonary arteries: Systolic pressure was within the normal   range.  Impressions:  - Normal study. There was no evidence of a vegetation.  Assessment/Plan: Raynetta Osterloh is a 35 y.o. 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 She had neg echo.  Likely source is  a boil she had on her lower back about a month ago which self drained HIV negative.  Hep C neg CRP 32, WSR 78 FU bcx 10/2 ngtd  Recommendations Cont ancef - will need 6 week course for osteomyelitis Repeat cx pending - once neg 72 hours can place picc line Will not get TEE unless she becomes unstable since the osteomyelitis will require 6 weeks treatment and the TEE will not change management at this point.  Ordered MRI of Sternum and clavicleto better delineate infectious process.   May need ortho consult to determine if needs aspiration for pain relief of the Sterno clav joint. Thank you very much for the consult. Will follow with you.  Sui Kasparek P   12/31/2015, 2:42 PM

## 2016-01-01 ENCOUNTER — Inpatient Hospital Stay: Payer: Medicaid Other

## 2016-01-01 DIAGNOSIS — R0789 Other chest pain: Secondary | ICD-10-CM

## 2016-01-01 MED ORDER — GADOBENATE DIMEGLUMINE 529 MG/ML IV SOLN
15.0000 mL | Freq: Once | INTRAVENOUS | Status: AC | PRN
  Administered 2016-01-01: 15 mL via INTRAVENOUS

## 2016-01-01 MED ORDER — DIAZEPAM 5 MG/ML IJ SOLN
5.0000 mg | Freq: Once | INTRAMUSCULAR | Status: DC
Start: 2016-01-01 — End: 2016-01-01

## 2016-01-01 MED ORDER — CYCLOBENZAPRINE HCL 10 MG PO TABS
10.0000 mg | ORAL_TABLET | Freq: Two times a day (BID) | ORAL | Status: DC | PRN
  Administered 2016-01-01 (×2): 10 mg via ORAL
  Filled 2016-01-01 (×2): qty 1

## 2016-01-01 NOTE — Progress Notes (Signed)
[facility] CLINIC INFECTIOUS DISEASE PROGRESS NOTE Date of Admission:  12/29/2015     ID: Carmen Riley is a 35 y.o. 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 A little less tender on chest, still with painful respirations  ROS  Eleven systems are reviewed and negative except per hpi  Medications:  Antibiotics Given (last 72 hours)    Date/Time Action Medication Dose Rate   12/30/15 0603 Given   ceFAZolin (ANCEF) IVPB 1 g/50 mL premix 1 g 100 mL/hr   12/30/15 1329 Given   ceFAZolin (ANCEF) IVPB 1 g/50 mL premix 1 g 100 mL/hr   12/30/15 2031 Given   ceFAZolin (ANCEF) IVPB 2g/100 mL premix 2 g 200 mL/hr   12/31/15 0502 Given   ceFAZolin (ANCEF) IVPB 2g/100 mL premix 2 g 200 mL/hr   12/31/15 1510 Given   ceFAZolin (ANCEF) IVPB 2g/100 mL premix 2 g 200 mL/hr   12/31/15 2045 Given   ceFAZolin (ANCEF) IVPB 2g/100 mL premix 2 g 200 mL/hr   01/01/16 0506 Given   ceFAZolin (ANCEF) IVPB 2g/100 mL premix 2 g 200 mL/hr     . buprenorphine  2 mg Sublingual Daily  .  ceFAZolin (ANCEF) IV  2 g Intravenous Q8H  . enoxaparin (LOVENOX) injection  40 mg Subcutaneous Q24H  . sodium chloride flush  3 mL Intravenous Q12H    Objective: Vital signs in last 24 hours: Temp:  [98.1 F (36.7 C)-99.7 F (37.6 C)] 98.1 F (36.7 C) (10/05 0526) Pulse Rate:  [64-104] 64 (10/05 0526) Resp:  [16-18] 18 (10/05 0526) BP: (112-122)/(59-74) 118/74 (10/05 0526) SpO2:  [92 %-98 %] 92 % (10/05 0526) Constitutional:  oriented to person, place, and time. appears in pain HENT: [place]/AT, PERRLA, no scleral icterus Mouth/Throat: Oropharynx is clear and moist. No oropharyngeal exudate.  Cardiovascular: Normal rate, regular rhythm and normal heart sounds. Pulmonary/Chest painful resp, nml air movement but mild rhonchi, .  TTP over sternm and clavicle  Neck = supple, no nuchal  rigidity Abdominal: Soft. Bowel sounds are normal.  exhibits no distension. There is no tenderness.  Lymphadenopathy: no cervical adenopathy. No axillary adenopathy Neurological: alert and oriented to person, place, and time.  Skin: Skin is warm and dry. No rash noted. No erythema. No stigmata of endocarditis  Psychiatric: a normal mood and affect.  behavior is normal.    Lab Results  Recent Labs  12/30/15 0524 12/30/15 0645  WBC  --  14.0*  HGB  --  11.2*  HCT  --  32.4*  NA 138  --   K 3.3*  --   CL 106  --   CO2 27  --   BUN 14  --   CREATININE 0.69  --     Microbiology: Results for orders placed or performed during the hospital encounter of 12/29/15  MRSA PCR Screening     Status: None   Collection Time: 12/29/15  5:23 PM  Result Value Ref Range Status   MRSA by PCR NEGATIVE NEGATIVE Final    Comment:        The GeneXpert MRSA Assay (FDA approved for NASAL specimens only), is one component of a comprehensive MRSA colonization surveillance program. It is not intended to diagnose MRSA infection nor to guide or monitor treatment for MRSA infections.    Specimen Description BLOOD LEFT HAND   Special Requests BOTTLES  DRAWN AEROBIC AND ANAEROBIC 5CC   Culture Setup Time GRAM POSITIVE COCCI  IN BOTH AEROBIC AND ANAEROBIC BOTTLES  CRITICAL RESULT CALLED TO, READ BACK BY AND VERIFIED WITH: NATE COOKSON AT [id number] 12/29/15.PMH  CONFIRMED BY RWW  [facility]       Culture STAPHYLOCOCCUS AUREUS    Report Status 12/31/2015 FINAL   Organism ID, Bacteria STAPHYLOCOCCUS AUREUS   Resulting Agency [facility]  Susceptibility    Staphylococcus aureus    MIC    CIPROFLOXACIN <=0.5 SENSITIVE "><=0.5 SENSI... Sensitive    CLINDAMYCIN <=0.25 SENSITIVE "><=0.25 SENS... Sensitive    ERYTHROMYCIN <=0.25 SENSITIVE "><=0.25 SENS... Sensitive    GENTAMICIN <=0.5 SENSITIVE "><=0.5 SENSI... Sensitive    Inducible Clindamycin NEGATIVE  Sensitive    OXACILLIN  0.5 SENSITIVE  Sensitive    RIFAMPIN <=0.5 SENSITIVE "><=0.5 SENSI... Sensitive    TETRACYCLINE <=1 SENSITIVE "><=1 SENSITIVE  Sensitive    TRIMETH/SULFA <=10 SENSITIVE "><=10 SENSIT... Sensitive    VANCOMYCIN <=0.5 SENSITIVE "><=0.5 SENSI... Sensitive       Studies/Results: Ct Angio Chest Pe W Or Wo Contrast  Addendum Date: 12/30/2015   ADDENDUM REPORT: 12/30/2015 18:37 ADDENDUM: These results were called by telephone at the time of interpretation on 12/30/2015 at 6:36 pm to Dr. Elisabeth PigeonVachhani, who verbally acknowledged these results. Electronically Signed   By: Mitzi HansenLance  Furusawa-Stratton M.D.   On: 12/30/2015 18:37   Result Date: 12/30/2015 CLINICAL DATA:  35 y/o F; upper chest pain worsening over several days with some difficulty breathing. Staph aureus bacteremia. Possible osteomyelitis of the clavicle. EXAM: CT ANGIOGRAPHY CHEST WITH CONTRAST TECHNIQUE: Multidetector CT imaging of the chest was performed using the standard protocol during bolus administration of intravenous contrast. Multiplanar CT image reconstructions and MIPs were obtained to evaluate the vascular anatomy. CONTRAST:  75 cc Isovue 370. COMPARISON:  None. FINDINGS: Cardiovascular: Satisfactory opacification of the pulmonary arteries to the segmental level. No evidence of pulmonary embolism. Normal heart size. No pericardial effusion. Mediastinum/Nodes: There is fat stranding within the upper anterior mediastinum an soft tissue thickening subjacent to the sternum as well as stranding of subcutaneous fat within the lower anterior neck and upper chest. No definite osseous erosions of the clavicles or sternoclavicular joints is identified. Lungs/Pleura: There are numerous necrotic pulmonary nodules throughout the lungs bilaterally. Small bilateral pleural effusions. Upper Abdomen: Splenomegaly. Musculoskeletal: No osseous erosion. Review of the MIP images confirms the above findings. IMPRESSION: 1. Multiple necrotic nodules throughout the  lungs consistent with septic emboli. Small bilateral pleural effusions. 2. Soft tissue thickening anterior and posterior to the manubrium and upper sternum and fat stranding within the subcutaneous fat of the anterior lower neck and upper chest and within the anterior upper mediastinum likely represents infectious process possibly centered in the manubrium or a sternoclavicular joints. No discrete osseous erosion or fluid collection is identified. 3. No pulmonary embolus. Electronically Signed: By: Mitzi HansenLance  Furusawa-Stratton M.D. On: 12/30/2015 18:11   Echo  - Left ventricle: The cavity size was normal. Systolic function was   normal. The estimated ejection fraction was in the range of 60%   to 65%. Wall motion was normal; there were no regional wall   motion abnormalities. - Left atrium: The atrium was normal in size. - Right ventricle: Systolic function was normal. - Pulmonary arteries: Systolic pressure was within the normal   range.  Impressions:  - Normal study. There was no evidence of a vegetation.  Assessment/Plan: Atilano MedianSarah Elizabeth Riley is a 35 y.o. 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 She had neg echo.  Likely source is a boil she had on her lower back about a month ago which self drained HIV negative.  Hep C neg CRP 32, WSR 78 FU bcx 10/2 ngtd  Recommendations Place PICC- ordered Cont ancef - will need 6 week course for osteomyelitis Will not get TEE unless she becomes unstable since the osteomyelitis will require 6 weeks treatment and the TEE will not change management at this point.  Ordered MRI of Sternum and clavicleto better delineate infectious process.   May need ortho consult to determine if needs aspiration for pain relief of the Sterno clav joint. Thank you very much for  the consult. Will follow with you.  Clella Mckeel P   01/01/2016, 11:29 AM
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
## 2016-01-01 NOTE — Progress Notes (Signed)
Infectious Disease Long Term IV Antibiotic Orders  Diagnosis: MSSA bacteremia and sternal osteomyelitis  HIV negative.  Hep C neg CRP 32, WSR 78 FU bcx 10/2 ngtd  Culture results Culture STAPHYLOCOCCUS AUREUS    Report Status 12/31/2015 FINAL   Organism ID, Bacteria STAPHYLOCOCCUS AUREUS   Resulting Agency [facility]  Susceptibility    Staphylococcus aureus    MIC    CIPROFLOXACIN <=0.5 SENSITIVE "><=0.5 SENSI... Sensitive    CLINDAMYCIN <=0.25 SENSITIVE "><=0.25 SENS... Sensitive    ERYTHROMYCIN <=0.25 SENSITIVE "><=0.25 SENS... Sensitive    GENTAMICIN <=0.5 SENSITIVE "><=0.5 SENSI... Sensitive    Inducible Clindamycin NEGATIVE  Sensitive    OXACILLIN 0.5 SENSITIVE  Sensitive    RIFAMPIN <=0.5 SENSITIVE "><=0.5 SENSI... Sensitive    TETRACYCLINE <=1 SENSITIVE "><=1 SENSITIVE  Sensitive    TRIMETH/SULFA <=10 SENSITIVE "><=10 SENSIT... Sensitive    VANCOMYCIN <=0.5 SENSITIVE "><=0.5 SENSI... Sensitive      Allergies: No Known Allergies  Discharge antibiotics Cefazolin     2 grams every 8 hours  PICC Care per protocol Labs weekly while on IV antibiotics      CBC w diff   Comprehensive met panel ESR, CRP  Planned duration of antibiotics 6 weeks  Stop date Feb 12, 2016 Follow up clinic date within 4 weeks  FAX weekly labs to [phone]  Leonel Ramsay, MD

## 2016-01-01 NOTE — Care Management (Signed)
Spoke with nurse about adjusting IV administration time so that home health RN can go out before 4 PM tomorrow. Barbara CowerJason with [facility] aware of patient potential discharge tomorrow.
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
## 2016-01-01 NOTE — Progress Notes (Signed)
Assumed pt around 1600-1900. Headache decreased with tylenol. PICC line in place. Awaiting X-ray results.

## 2016-01-01 NOTE — Care Management (Signed)
Spoke with patient regarding home health RN and IV antibiotics. She is in a lot a pain verbalized to me while talking- she states she has received pain medication recently. She would [facility] for [facility]. IV antibiotic daily cost ~0.21 cents. RNCM will continue to follow.
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
## 2016-01-02 LAB — CBC
HCT: 34 % — ABNORMAL LOW (ref 35.0–47.0)
HEMOGLOBIN: 11.3 g/dL — AB (ref 12.0–16.0)
MCH: 28.6 pg (ref 26.0–34.0)
MCHC: 33.2 g/dL (ref 32.0–36.0)
MCV: 86.2 fL (ref 80.0–100.0)
Platelets: 314 10*3/uL (ref 150–440)
RBC: 3.95 MIL/uL (ref 3.80–5.20)
RDW: 13 % (ref 11.5–14.5)
WBC: 10.8 10*3/uL (ref 3.6–11.0)

## 2016-01-02 MED ORDER — CYCLOBENZAPRINE HCL 10 MG PO TABS: 10.0000 mg | ORAL_TABLET | Freq: Two times a day (BID) | ORAL | 0 refills | Status: DC | PRN

## 2016-01-02 MED ORDER — CEFAZOLIN SODIUM-DEXTROSE 2-4 GM/100ML-% IV SOLN: 2.0000 g | Freq: Three times a day (TID) | INTRAVENOUS | 0 refills | Status: DC

## 2016-01-02 NOTE — Progress Notes (Signed)
Discharge packet reviewed with patient, prescriptions attached. Patient verbalizes understanding. Patient's mom to transport home. PICC line in place for home antibiotics.

## 2016-01-02 NOTE — Progress Notes (Signed)
Verified with Toniann FailWendy, Pharmacist, Ancef doses today can be given at 10am and 5pm in order to accommodate patient being discharged today and get all scheduled doses with home health starting to come tomorrow morning.   Case Manager, [name], updated so she can update home health agency.

## 2016-01-02 NOTE — Discharge Instructions (Signed)
Labs weekly while on IV antibiotics      CBC w diff         Comprehensive met panel ESR, CRP  Send results to Dr. Blane Ohara office.  Planned duration of antibiotics 6 weeks FAX weekly labs to [phone]  Need a repeat CT chest in 1-2 weeks and cmmunicate result with Dr. Ola Spurr.

## 2016-01-02 NOTE — Care Management (Signed)
Patient to discharge home today.  Patient states that her family is to transport her home, however they are travelling from [place]. Bedside RN spoke with pharmacy to adjust medication timing.  Patient to receive anfed today at 1000 am, and 5pm.  Patient to discharge after 5 pm dosing.  Start of home dosing tomorrow am.  [name] with [facility] notified, MD aware.  RNCM signing off.
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
## 2016-01-16 ENCOUNTER — Other Ambulatory Visit: Payer: Self-pay | Admitting: Infectious Diseases

## 2016-01-16 DIAGNOSIS — I330 Acute and subacute infective endocarditis: Secondary | ICD-10-CM

## 2016-01-16 DIAGNOSIS — B958 Unspecified staphylococcus as the cause of diseases classified elsewhere: Secondary | ICD-10-CM

## 2016-01-16 DIAGNOSIS — M869 Osteomyelitis, unspecified: Secondary | ICD-10-CM

## 2016-02-08 ENCOUNTER — Encounter: Payer: Self-pay | Admitting: Emergency Medicine

## 2016-02-08 ENCOUNTER — Emergency Department: Payer: Medicaid Other

## 2016-02-08 ENCOUNTER — Observation Stay
Admission: EM | Admit: 2016-02-08 | Discharge: 2016-02-10 | Disposition: A | Discharge status: Home / Self Care | Payer: Medicaid Other | Attending: Internal Medicine | Admitting: Internal Medicine

## 2016-02-08 DIAGNOSIS — Z23 Encounter for immunization: Secondary | ICD-10-CM | POA: Insufficient documentation

## 2016-02-08 DIAGNOSIS — B9689 Other specified bacterial agents as the cause of diseases classified elsewhere: Secondary | ICD-10-CM | POA: Diagnosis not present

## 2016-02-08 DIAGNOSIS — Y828 Other medical devices associated with adverse incidents: Secondary | ICD-10-CM | POA: Insufficient documentation

## 2016-02-08 DIAGNOSIS — R52 Pain, unspecified: Secondary | ICD-10-CM

## 2016-02-08 DIAGNOSIS — Z87891 Personal history of nicotine dependence: Secondary | ICD-10-CM | POA: Insufficient documentation

## 2016-02-08 DIAGNOSIS — F909 Attention-deficit hyperactivity disorder, unspecified type: Secondary | ICD-10-CM | POA: Insufficient documentation

## 2016-02-08 DIAGNOSIS — T82868A Thrombosis of vascular prosthetic devices, implants and grafts, initial encounter: Principal | ICD-10-CM | POA: Insufficient documentation

## 2016-02-08 DIAGNOSIS — Z792 Long term (current) use of antibiotics: Secondary | ICD-10-CM | POA: Insufficient documentation

## 2016-02-08 DIAGNOSIS — R072 Precordial pain: Secondary | ICD-10-CM | POA: Diagnosis not present

## 2016-02-08 DIAGNOSIS — Z452 Encounter for adjustment and management of vascular access device: Secondary | ICD-10-CM | POA: Insufficient documentation

## 2016-02-08 DIAGNOSIS — M869 Osteomyelitis, unspecified: Secondary | ICD-10-CM | POA: Diagnosis not present

## 2016-02-08 DIAGNOSIS — I82622 Acute embolism and thrombosis of deep veins of left upper extremity: Secondary | ICD-10-CM

## 2016-02-08 DIAGNOSIS — I330 Acute and subacute infective endocarditis: Secondary | ICD-10-CM | POA: Insufficient documentation

## 2016-02-08 DIAGNOSIS — I82409 Acute embolism and thrombosis of unspecified deep veins of unspecified lower extremity: Secondary | ICD-10-CM | POA: Diagnosis present

## 2016-02-08 HISTORY — DX: Endocarditis, valve unspecified: I38

## 2016-02-08 HISTORY — DX: Presence of other vascular implants and grafts: Z95.828

## 2016-02-08 LAB — CBC WITH DIFFERENTIAL/PLATELET
BASOS ABS: 0.1 10*3/uL (ref 0–0.1)
Basophils Relative: 1 %
EOS PCT: 17 %
Eosinophils Absolute: 1 10*3/uL — ABNORMAL HIGH (ref 0–0.7)
HEMATOCRIT: 32 % — AB (ref 35.0–47.0)
Hemoglobin: 10.9 g/dL — ABNORMAL LOW (ref 12.0–16.0)
LYMPHS ABS: 2 10*3/uL (ref 1.0–3.6)
LYMPHS PCT: 34 %
MCH: 28.9 pg (ref 26.0–34.0)
MCHC: 34.1 g/dL (ref 32.0–36.0)
MCV: 84.8 fL (ref 80.0–100.0)
MONO ABS: 0.4 10*3/uL (ref 0.2–0.9)
MONOS PCT: 7 %
NEUTROS ABS: 2.4 10*3/uL (ref 1.4–6.5)
Neutrophils Relative %: 41 %
PLATELETS: 258 10*3/uL (ref 150–440)
RBC: 3.77 MIL/uL — ABNORMAL LOW (ref 3.80–5.20)
RDW: 13.5 % (ref 11.5–14.5)
WBC: 5.9 10*3/uL (ref 3.6–11.0)

## 2016-02-08 LAB — COMPREHENSIVE METABOLIC PANEL
ALBUMIN: 3.5 g/dL (ref 3.5–5.0)
ALK PHOS: 118 U/L (ref 38–126)
ALT: 14 U/L (ref 14–54)
ANION GAP: 7 (ref 5–15)
AST: 46 U/L — ABNORMAL HIGH (ref 15–41)
BILIRUBIN TOTAL: 0.3 mg/dL (ref 0.3–1.2)
BUN: 7 mg/dL (ref 6–20)
CALCIUM: 8.8 mg/dL — AB (ref 8.9–10.3)
CO2: 27 mmol/L (ref 22–32)
Chloride: 104 mmol/L (ref 101–111)
Creatinine, Ser: 0.54 mg/dL (ref 0.44–1.00)
GFR calc Af Amer: >60 mL/min (ref >60)
GLUCOSE: 100 mg/dL — AB (ref 65–99)
POTASSIUM: 3.4 mmol/L — AB (ref 3.5–5.1)
Sodium: 138 mmol/L (ref 135–145)
TOTAL PROTEIN: 7.2 g/dL (ref 6.5–8.1)

## 2016-02-08 LAB — PROTIME-INR
INR: 0.97
PROTHROMBIN TIME: 12.9 s (ref 11.4–15.2)

## 2016-02-08 LAB — APTT: aPTT: 33 seconds (ref 24–36)

## 2016-02-08 LAB — SEDIMENTATION RATE: Sed Rate: 52 mm/hr — ABNORMAL HIGH (ref 0–20)

## 2016-02-08 MED ORDER — CEFAZOLIN SODIUM-DEXTROSE 2-4 GM/100ML-% IV SOLN
2.0000 g | Freq: Once | INTRAVENOUS | Status: AC
  Administered 2016-02-08: 2 g via INTRAVENOUS
  Filled 2016-02-08: qty 100

## 2016-02-08 MED ORDER — APIXABAN 5 MG PO TABS
10.0000 mg | ORAL_TABLET | Freq: Two times a day (BID) | ORAL | Status: DC
  Administered 2016-02-08 – 2016-02-10 (×4): 10 mg via ORAL
  Filled 2016-02-08 (×4): qty 2

## 2016-02-08 MED ORDER — HEPARIN BOLUS VIA INFUSION
4300.0000 [IU] | Freq: Once | INTRAVENOUS | Status: DC
  Filled 2016-02-08: qty 4300

## 2016-02-08 MED ORDER — HEPARIN SODIUM (PORCINE) 5000 UNIT/ML IJ SOLN: 4000.0000 [IU] | Freq: Once | INTRAMUSCULAR | Status: DC

## 2016-02-08 MED ORDER — HEPARIN (PORCINE) IN NACL 100-0.45 UNIT/ML-% IJ SOLN: 12.0000 [IU]/kg/h | Freq: Once | INTRAMUSCULAR | Status: DC

## 2016-02-08 MED ORDER — HEPARIN (PORCINE) IN NACL 100-0.45 UNIT/ML-% IJ SOLN
1250.0000 [IU]/h | INTRAMUSCULAR | Status: DC
  Filled 2016-02-08: qty 250

## 2016-02-08 NOTE — ED Notes (Signed)
Patient transported to Ultrasound 

## 2016-02-08 NOTE — ED Triage Notes (Signed)
Patient is a patient of Dr. Sampson GoonFitzgerald.  Patient has a Right upper arm PICC line due to history of staph infection, endocarditis and receiving home antibiotic therapy.    Patient states feet have been swelling since Monday (6 days ago) and initially patient noticed a rash to feet.  2 days ago, patient noticed rash to left upper arm and pain to left upper arm into shoulder.  Patient has also noticed wetness under PICC dressing and hard area around PICC insertion site.

## 2016-02-08 NOTE — ED Notes (Signed)
Pt returned from ultrasound. Call bell at side.

## 2016-02-08 NOTE — Progress Notes (Signed)
ANTICOAGULATION CONSULT NOTE - Initial Consult  Pharmacy Consult for Heparin dosing Indication: DVT  No Known Allergies  Patient Measurements: Height: 5\' 4"  (162.6 cm) Weight: 180 lb (81.6 kg) IBW/kg (Calculated) : 54.7 Heparin Dosing Weight: 72.4 kg  Vital Signs: Temp: 98.2 F (36.8 C) (11/12 1740) Temp Source: Oral (11/12 1740) BP: 124/82 (11/12 1920) Pulse Rate: 108 (11/12 1930)  Labs:  Recent Labs  02/08/16 2049  HGB 10.9*  HCT 32.0*  PLT 258  CREATININE 0.54    Estimated Creatinine Clearance: 103.4 mL/min (by C-G formula based on SCr of 0.54 mg/dL).   Medical History: Past Medical History:  Diagnosis Date  . ADHD (attention deficit hyperactivity disorder)   . Endocarditis   . S/P PICC central line placement     Assessment: Patient is a 34yo female to be initiated on Heparin drip for DVT.  Goal of Therapy:  Heparin level 0.3-0.7 units/ml Monitor platelets by anticoagulation protocol: Yes   Plan:  Give 4300 units bolus x 1 Start heparin infusion at 1250 units/hr Check anti-Xa level in 6 hours and daily while on heparin Continue to monitor H&H and platelets  Clovia CuffLisa Jalayah Gutridge, PharmD, BCPS 02/08/2016 10:57 PM

## 2016-02-08 NOTE — ED Notes (Signed)
Not able to flush or aspirate blood from picc line. This rn attempt iv initiation x1 without success. Phyllis Gingeravid rn in to attempt ultrasound guided iv x3 without success. Lab called for venipuncture assistance.

## 2016-02-08 NOTE — ED Provider Notes (Signed)
[facility] Emergency Department Provider Note        Time seen: ----------------------------------------- 7:30 PM on 02/08/2016 -----------------------------------------    I have reviewed the triage vital signs and the nursing notes.   HISTORY  Chief Complaint Foot Swelling and Rash    HPI Carmen Riley is a 35 y.o. female who presents to the ER for left arm pain and swelling. Patient is a PICC line in her left arm that she is receiving IV antibiotics through for endocarditis. She states PICC line is said to be removed this date week. She has not had a fever or chills, she has noticed some foot swelling and has noticed somewhat of a rash to her feet and left upper arm. She has been told she is sensitive to the tape under the PICC line dressing. Left arm is tender to touch around the PICC line insertion site   Past Medical History:  Diagnosis Date  . ADHD (attention deficit hyperactivity disorder)   . Endocarditis   . S/P PICC central line placement     Patient Active Problem List   Diagnosis Date Noted  . Sternal pain   . Pressure injury of skin 12/30/2015  . Sepsis ([facility]) 12/29/2015    Past Surgical History:  Procedure Laterality Date  . CESAREAN SECTION      Allergies Patient has no known allergies.  Social History Social History  Substance Use Topics  . Smoking status: Former Games developermoker  . Smokeless tobacco: Never Used  . Alcohol use No    Review of Systems Constitutional: Negative for fever. Cardiovascular: Negative for chest pain. Respiratory: Negative for shortness of breath. Gastrointestinal: Negative for abdominal pain, vomiting and diarrhea. Genitourinary: Negative for dysuria. Musculoskeletal: Positive for left arm pain, peripheral edema Skin: Positive for rash Neurological: Negative for headaches, focal weakness or numbness.  10-point ROS otherwise  negative.  ____________________________________________   PHYSICAL EXAM:  VITAL SIGNS: ED Triage Vitals [02/08/16 1740]  Enc Vitals Group     BP (!) 152/89     Pulse Rate 93     Resp 16     Temp 98.2 F (36.8 C)     Temp Source Oral     SpO2 99 %     Weight 180 lb (81.6 kg)     Height 5\' 4"  (1.626 m)     Head Circumference      Peak Flow      Pain Score 5     Pain Loc      Pain Edu?      Excl. in GC?     Constitutional: Alert and oriented. Well appearing and in no distress. Eyes: Conjunctivae are normal. PERRL. Normal extraocular movements. ENT   Head: Normocephalic and atraumatic.   Nose: No congestion/rhinnorhea.   Mouth/Throat: Mucous membranes are moist.   Neck: No stridor. Cardiovascular: Normal rate, regular rhythm. No murmurs, rubs, or gallops. Respiratory: Normal respiratory effort without tachypnea nor retractions. Breath sounds are clear and equal bilaterally. No wheezes/rales/rhonchi. Gastrointestinal: Soft and nontender. Normal bowel sounds Musculoskeletal: Left upper extremity swelling, posterior edema and erythema relative to the PICC line, PICC line appears to be withdrawn approximately 5 cm. There is dermatitis underlying the Tegaderm dressing suggestive of adhesive allergy. Bilateral lower extremity edema is noted Neurologic:  Normal speech and language. No gross focal neurologic deficits are appreciated.  Skin:  Left upper extremity erythema and rash as dictated above Psychiatric: Mood and affect are normal. Speech and behavior are normal.  ____________________________________________  ED COURSE:  Pertinent labs & imaging results that were available during my care of the patient were reviewed by me and considered in my medical decision making (see chart for details). Clinical Course   Patient presents to ER with concerns for PICC line infection, DVT or medication reaction. We will assess with labs and  imaging.  Procedures ____________________________________________   LABS (pertinent positives/negatives)  Labs Reviewed  CBC WITH DIFFERENTIAL/PLATELET - Abnormal; Notable for the following:       Result Value   RBC 3.77 (*)    Hemoglobin 10.9 (*)    HCT 32.0 (*)    Eosinophils Absolute 1.0 (*)    All other components within normal limits  SEDIMENTATION RATE - Abnormal; Notable for the following:    Sed Rate 52 (*)    All other components within normal limits  COMPREHENSIVE METABOLIC PANEL - Abnormal; Notable for the following:    Potassium 3.4 (*)    Glucose, Bld 100 (*)    Calcium 8.8 (*)    AST 46 (*)    All other components within normal limits  CULTURE, BLOOD (ROUTINE X 2)  CULTURE, BLOOD (ROUTINE X 2)  APTT  PROTIME-INR  URINALYSIS COMPLETEWITH MICROSCOPIC ([facility] ONLY)  CBC    RADIOLOGY Images were viewed by me  Upper extremity ultrasound, chest x-ray  IMPRESSION: Thrombus surrounding the PICC line within 1 of the left brachial veins. The remainder of the left upper extremity venous drainage is widely patent. IMPRESSION: No active disease.  Left PICC line terminates over the expected location of the innominate vein. ____________________________________________  FINAL ASSESSMENT AND PLAN  DVT, endocarditis  Plan: Patient with labs and imaging as dictated above. Patient presented to the ER with left upper arm swelling and pain. PICC line is malfunctioning. Patient will be started on Eliquis, she will need to have her PICC line removed and an additional line replaced. She also needs her dose of Ancef. I did place a peripheral IV for Ancef infusion. I have discussed with the hospitalist for observation.   Emily FilbertWilliams, Jonathan E, MD   Note: This dictation was prepared with Dragon dictation. Any transcriptional errors that result from this process are unintentional    Emily FilbertJonathan E Williams, MD 02/08/16 2317

## 2016-02-08 NOTE — ED Notes (Signed)
Pt remains in ultrasound.

## 2016-02-08 NOTE — ED Notes (Addendum)
Pt recently diagnosed with endocarditis. Pt is receiving iv antibiotics  Via left upper extremity PICC line. Pt states she has a rash to bilateral arms, feet. Pt with edema noted to bilateral feet approx 1-2+. Pt with pain and swelling noted to left upper arm form elbow to shoulder. Pt states she has possibly noticied picc line extending out further from arm. Pt with area that is hard to palpation noted to medial upper picc area above where line inserts into skin. Left upper arm is approx 25% larger than right upper arm. Cms intact to left fingers, 3+ radial left pulse noted. Pt states she is to end antibiotic therapy on 02/13/2016. Pt denies shob, is ambulatory without difficulty and is able to move all extremities without difficulty. Skin warm and dry, no resp distress noted. Fine none raised discrete rash noted to top of bilateral feet. Rash noted beneath tape that is securing left picc line that appears like excoriation from repeated dressing changes.

## 2016-02-08 NOTE — ED Notes (Signed)
Attempted to draw blood from PICC line. No blood return.  Both lines flush without resistance.

## 2016-02-08 NOTE — ED Notes (Signed)
Pt updated on progression of venipuncture time line. Pt verbalizes understanding.

## 2016-02-08 NOTE — ED Notes (Signed)
Sent down 2 sets of blood cultures and a full rainbow

## 2016-02-09 ENCOUNTER — Observation Stay: Payer: Medicaid Other

## 2016-02-09 ENCOUNTER — Ambulatory Visit: Payer: Medicaid Other

## 2016-02-09 DIAGNOSIS — I82622 Acute embolism and thrombosis of deep veins of left upper extremity: Secondary | ICD-10-CM

## 2016-02-09 DIAGNOSIS — I82409 Acute embolism and thrombosis of unspecified deep veins of unspecified lower extremity: Secondary | ICD-10-CM | POA: Diagnosis present

## 2016-02-09 LAB — CBC
HEMATOCRIT: 30.7 % — AB (ref 35.0–47.0)
HEMOGLOBIN: 10.2 g/dL — AB (ref 12.0–16.0)
MCH: 28.3 pg (ref 26.0–34.0)
MCHC: 33.3 g/dL (ref 32.0–36.0)
MCV: 85.1 fL (ref 80.0–100.0)
Platelets: 227 10*3/uL (ref 150–440)
RBC: 3.61 MIL/uL — ABNORMAL LOW (ref 3.80–5.20)
RDW: 13.1 % (ref 11.5–14.5)
WBC: 4.8 10*3/uL (ref 3.6–11.0)

## 2016-02-09 MED ORDER — CEFAZOLIN SODIUM-DEXTROSE 2-4 GM/100ML-% IV SOLN
2.0000 g | Freq: Three times a day (TID) | INTRAVENOUS | Status: DC
Start: 2016-02-09 — End: 2016-02-10
  Administered 2016-02-09 – 2016-02-10 (×3): 2 g via INTRAVENOUS
  Filled 2016-02-09 (×4): qty 100

## 2016-02-09 MED ORDER — PNEUMOCOCCAL VAC POLYVALENT 25 MCG/0.5ML IJ INJ
0.5000 mL | INJECTION | INTRAMUSCULAR | Status: AC
  Administered 2016-02-10: 0.5 mL via INTRAMUSCULAR
  Filled 2016-02-09: qty 0.5

## 2016-02-09 MED ORDER — ACETAMINOPHEN 325 MG PO TABS: 650.0000 mg | ORAL_TABLET | Freq: Four times a day (QID) | ORAL | Status: DC | PRN

## 2016-02-09 MED ORDER — ONDANSETRON HCL 4 MG/2ML IJ SOLN: 4.0000 mg | Freq: Four times a day (QID) | INTRAMUSCULAR | Status: DC | PRN

## 2016-02-09 MED ORDER — APIXABAN 5 MG PO TABS: 5.0000 mg | ORAL_TABLET | Freq: Two times a day (BID) | ORAL | Status: DC

## 2016-02-09 MED ORDER — ACETAMINOPHEN 650 MG RE SUPP: 650.0000 mg | Freq: Four times a day (QID) | RECTAL | Status: DC | PRN

## 2016-02-09 MED ORDER — SODIUM CHLORIDE 0.9% FLUSH
3.0000 mL | Freq: Two times a day (BID) | INTRAVENOUS | Status: DC
  Administered 2016-02-09: 3 mL via INTRAVENOUS

## 2016-02-09 MED ORDER — BISACODYL 5 MG PO TBEC: 5.0000 mg | DELAYED_RELEASE_TABLET | Freq: Every day | ORAL | Status: DC | PRN

## 2016-02-09 MED ORDER — MAGNESIUM CITRATE PO SOLN
1.0000 | Freq: Once | ORAL | Status: DC | PRN
  Filled 2016-02-09: qty 296

## 2016-02-09 MED ORDER — SODIUM CHLORIDE 0.9% FLUSH
3.0000 mL | Freq: Two times a day (BID) | INTRAVENOUS | Status: DC
  Administered 2016-02-09 (×2): 3 mL via INTRAVENOUS

## 2016-02-09 MED ORDER — BUPRENORPHINE HCL 2 MG SL SUBL
2.0000 mg | SUBLINGUAL_TABLET | Freq: Two times a day (BID) | SUBLINGUAL | Status: DC
  Administered 2016-02-09 – 2016-02-10 (×3): 2 mg via SUBLINGUAL
  Filled 2016-02-09 (×3): qty 1

## 2016-02-09 MED ORDER — GADOBENATE DIMEGLUMINE 529 MG/ML IV SOLN
15.0000 mL | Freq: Once | INTRAVENOUS | Status: AC | PRN
  Administered 2016-02-09: 15 mL via INTRAVENOUS

## 2016-02-09 MED ORDER — SENNOSIDES-DOCUSATE SODIUM 8.6-50 MG PO TABS: 1.0000 | ORAL_TABLET | Freq: Every evening | ORAL | Status: DC | PRN

## 2016-02-09 MED ORDER — SODIUM CHLORIDE 0.9 % IV SOLN
250.0000 mL | INTRAVENOUS | Status: DC | PRN
Start: 2016-02-09 — End: 2016-02-10

## 2016-02-09 MED ORDER — SODIUM CHLORIDE 0.9% FLUSH: 3.0000 mL | INTRAVENOUS | Status: DC | PRN

## 2016-02-09 MED ORDER — ONDANSETRON HCL 4 MG PO TABS: 4.0000 mg | ORAL_TABLET | Freq: Four times a day (QID) | ORAL | Status: DC | PRN

## 2016-02-09 NOTE — ED Notes (Signed)
Pt updated on admission process. Pt verbalizes understanding.  

## 2016-02-09 NOTE — Progress Notes (Signed)
[facility]  Patient Status: Active  [facility] is providing the following services: SN, IV ABX (Ancef 2g q8 until 02/12/16)  If patient discharges after hours, please call [phone].   Dimple CaseyJason E Hinton 02/09/2016, 8:50 AM

## 2016-02-09 NOTE — Plan of Care (Signed)
Problem: Safety: Goal: Ability to remain free from injury will improve Pt is a low fall risk. Has steady gait

## 2016-02-09 NOTE — H&P (Signed)
Carmen Riley is an 35 y.o. female.   Chief Complaint: Foot swelling HPI: The patient with past medical history significant for endocarditis presents to the emergency department complaining of foot swelling. She states that her feet have felt tight in her shoes and her lower extremities feel heavy. She also reports pain in her left arm as well as itching at the site of her PICC line. She has been receiving antibiotics for endocarditis and has been monitoring her temperature per instructions from Dr. Ola Spurr, her infectious disease doctor. She reports MAXIMUM TEMPERATURE of 99.17F but has felt subjectively febrile. She has had some intermittent nausea over this last week but denies nausea or vomiting today. Ultrasound of the upper extremity revealed a nonocclusive DVT. The patient is not tachycardic and she denies chest pain. Notably, she has had some vague chest discomfort for a long time now and she is scheduled for an MRI of her sternum to rule out osteomyelitis. Due to her need for IV access and continued antibiotics emergency department staff called the hospitalist service for admission.  Past Medical History:  Diagnosis Date  . ADHD (attention deficit hyperactivity disorder)   . Endocarditis   . S/P PICC central line placement     Past Surgical History:  Procedure Laterality Date  . CESAREAN SECTION      Family History  Problem Relation Age of Onset  . Healthy Mother   . Colon cancer Father   . Asthma Father    Social History:  reports that she has quit smoking. She has never used smokeless tobacco. She reports that she does not drink alcohol or use drugs.  Allergies: No Known Allergies  Medications Prior to Admission  Medication Sig Dispense Refill  . acetaminophen (TYLENOL) 500 MG chewable tablet Chew 500 mg by mouth every 6 (six) hours as needed for pain.    . buprenorphine-naloxone (SUBOXONE) 2-0.5 MG SUBL SL tablet Place 1 tablet under the tongue 2 (two) times daily.     Marland Kitchen ceFAZolin (ANCEF) 2-4 GM/100ML-% IVPB Inject 100 mLs (2 g total) into the vein every 8 (eight) hours. X 6 weeks 126 each 0  . ibuprofen (ADVIL,MOTRIN) 200 MG tablet Take 800 mg by mouth every 6 (six) hours as needed.    Marland Kitchen amphetamine-dextroamphetamine (ADDERALL) 20 MG tablet Take 20 mg by mouth 3 (three) times daily.      Results for orders placed or performed during the hospital encounter of 02/08/16 (from the past 48 hour(s))  CBC with Differential/Platelet     Status: Abnormal   Collection Time: 02/08/16  8:49 PM  Result Value Ref Range   WBC 5.9 3.6 - 11.0 K/uL   RBC 3.77 (L) 3.80 - 5.20 MIL/uL   Hemoglobin 10.9 (L) 12.0 - 16.0 g/dL   HCT 32.0 (L) 35.0 - 47.0 %   MCV 84.8 80.0 - 100.0 fL   MCH 28.9 26.0 - 34.0 pg   MCHC 34.1 32.0 - 36.0 g/dL   RDW 13.5 11.5 - 14.5 %   Platelets 258 150 - 440 K/uL   Neutrophils Relative % 41 %   Neutro Abs 2.4 1.4 - 6.5 K/uL   Lymphocytes Relative 34 %   Lymphs Abs 2.0 1.0 - 3.6 K/uL   Monocytes Relative 7 %   Monocytes Absolute 0.4 0.2 - 0.9 K/uL   Eosinophils Relative 17 %   Eosinophils Absolute 1.0 (H) 0 - 0.7 K/uL   Basophils Relative 1 %   Basophils Absolute 0.1 0 - 0.1 K/uL  Sedimentation rate     Status: Abnormal   Collection Time: 02/08/16  8:49 PM  Result Value Ref Range   Sed Rate 52 (H) 0 - 20 mm/hr  Comprehensive metabolic panel     Status: Abnormal   Collection Time: 02/08/16  8:49 PM  Result Value Ref Range   Sodium 138 135 - 145 mmol/L   Potassium 3.4 (L) 3.5 - 5.1 mmol/L   Chloride 104 101 - 111 mmol/L   CO2 27 22 - 32 mmol/L   Glucose, Bld 100 (H) 65 - 99 mg/dL   BUN 7 6 - 20 mg/dL   Creatinine, Ser 0.54 0.44 - 1.00 mg/dL   Calcium 8.8 (L) 8.9 - 10.3 mg/dL   Total Protein 7.2 6.5 - 8.1 g/dL   Albumin 3.5 3.5 - 5.0 g/dL   AST 46 (H) 15 - 41 U/L   ALT 14 14 - 54 U/L   Alkaline Phosphatase 118 38 - 126 U/L   Total Bilirubin 0.3 0.3 - 1.2 mg/dL   GFR calc non Af Amer >60 >60 mL/min   GFR calc Af Amer >60 >60  mL/min    Comment: (NOTE) The eGFR has been calculated using the CKD EPI equation. This calculation has not been validated in all clinical situations. eGFR's persistently <60 mL/min signify possible Chronic Kidney Disease.    Anion gap 7 5 - 15  APTT     Status: None   Collection Time: 02/08/16  8:49 PM  Result Value Ref Range   aPTT 33 24 - 36 seconds  Protime-INR     Status: None   Collection Time: 02/08/16  8:49 PM  Result Value Ref Range   Prothrombin Time 12.9 11.4 - 15.2 seconds   INR 0.97   CBC     Status: Abnormal   Collection Time: 02/09/16  4:06 AM  Result Value Ref Range   WBC 4.8 3.6 - 11.0 K/uL   RBC 3.61 (L) 3.80 - 5.20 MIL/uL   Hemoglobin 10.2 (L) 12.0 - 16.0 g/dL   HCT 30.7 (L) 35.0 - 47.0 %   MCV 85.1 80.0 - 100.0 fL   MCH 28.3 26.0 - 34.0 pg   MCHC 33.3 32.0 - 36.0 g/dL   RDW 13.1 11.5 - 14.5 %   Platelets 227 150 - 440 K/uL   Dg Chest 1 View  Result Date: 02/08/2016 CLINICAL DATA:  PICC line placement. EXAM: CHEST 1 VIEW COMPARISON:  01/01/2016 FINDINGS: Left PICC line terminates over the expected location the of the innominate vein. Cardiomediastinal silhouette is normal. Mediastinal contours appear intact. There is no evidence of focal airspace consolidation, pleural effusion or pneumothorax. Osseous structures are without acute abnormality. Soft tissues are grossly normal. IMPRESSION: No active disease. Left PICC line terminates over the expected location of the innominate vein. Electronically Signed   By: Fidela Salisbury M.D.   On: 02/08/2016 19:52   US Venous Img Upper Uni Left  Result Date: 02/08/2016 CLINICAL DATA:  Left upper extremity pain and swell for several days. EXAM: Left UPPER EXTREMITY VENOUS DOPPLER ULTRASOUND TECHNIQUE: Gray-scale sonography with graded compression, as well as color Doppler and duplex ultrasound were performed to evaluate the upper extremity deep venous system from the level of the subclavian vein and including the  jugular, axillary, basilic, radial, ulnar and upper cephalic vein. Spectral Doppler was utilized to evaluate flow at rest and with distal augmentation maneuvers. COMPARISON:  None. FINDINGS: Contralateral Subclavian Vein: Respiratory phasicity is normal and symmetric with the  symptomatic side. No evidence of thrombus. Normal compressibility. Internal Jugular Vein: No evidence of thrombus. Normal compressibility, respiratory phasicity and response to augmentation. Subclavian Vein: No evidence of thrombus. Normal compressibility, respiratory phasicity and response to augmentation. Axillary Vein: No evidence of thrombus. Normal compressibility, respiratory phasicity and response to augmentation. Cephalic Vein: No evidence of thrombus. Normal compressibility, respiratory phasicity and response to augmentation. Basilic Vein: No evidence of thrombus. Normal compressibility, respiratory phasicity and response to augmentation. Brachial Veins: There is thrombus within the left brachial vein surrounding the PICC line. No evidence of thrombus propagating more proximally. The other brachial vein is patent. Radial Veins: No evidence of thrombus. Normal compressibility, respiratory phasicity and response to augmentation. Ulnar Veins: No evidence of thrombus. Normal compressibility, respiratory phasicity and response to augmentation. Venous Reflux:  None visualized. Other Findings:  None visualized. IMPRESSION: Thrombus surrounding the PICC line within 1 of the left brachial veins. The remainder of the left upper extremity venous drainage is widely patent. Electronically Signed   By: Andreas Newport M.D.   On: 02/08/2016 22:57    Review of Systems  Constitutional: Negative for chills and fever.  HENT: Negative for sore throat and tinnitus.   Eyes: Negative for blurred vision and redness.  Respiratory: Negative for cough and shortness of breath.   Cardiovascular: Positive for chest pain (chronic). Negative for  palpitations, orthopnea and PND.  Gastrointestinal: Negative for abdominal pain, diarrhea, nausea and vomiting.  Genitourinary: Negative for dysuria, frequency and urgency.  Musculoskeletal: Negative for joint pain and myalgias.       Arm pain  Skin: Negative for rash.       No lesions  Neurological: Negative for speech change, focal weakness and weakness.  Endo/Heme/Allergies: Does not bruise/bleed easily.       No temperature intolerance  Psychiatric/Behavioral: Negative for depression and suicidal ideas.    Blood pressure 102/68, pulse 79, temperature 98.8 F (37.1 C), temperature source Oral, resp. rate 18, height 5' 4"  (1.626 m), weight 81.6 kg (180 lb), last menstrual period 01/29/2016, SpO2 96 %. Physical Exam  Vitals reviewed. Constitutional: She is oriented to person, place, and time. She appears well-developed and well-nourished. No distress.  HENT:  Head: Normocephalic and atraumatic.  Mouth/Throat: Oropharynx is clear and moist.  Eyes: Conjunctivae and EOM are normal. Pupils are equal, round, and reactive to light. No scleral icterus.  Neck: Normal range of motion. Neck supple. No JVD present. No tracheal deviation present. No thyromegaly present.  Cardiovascular: Normal rate, regular rhythm and normal heart sounds.  Exam reveals no gallop and no friction rub.   No murmur heard. Respiratory: Effort normal and breath sounds normal.  GI: Soft. Bowel sounds are normal. She exhibits no distension. There is no tenderness.  Genitourinary:  Genitourinary Comments: Deferred  Musculoskeletal: Normal range of motion. She exhibits edema (trace).  Lymphadenopathy:    She has no cervical adenopathy.  Neurological: She is alert and oriented to person, place, and time. No cranial nerve deficit. She exhibits normal muscle tone.  Skin: Skin is warm and dry. No rash noted. No erythema.  Psychiatric: She has a normal mood and affect. Her behavior is normal. Judgment and thought content  normal.     Assessment/Plan This is a 35 year old female admitted for DVT of her upper extremity. 1. DVT: Thrombus surrounding one of the left brachial veins with good flow throughout to tributary veins. The patient is on Eliquis. PICC line scheduled for removal. The patient denies tenderness in her lower extremities.  No palpable cords. 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 Previous echo was last month. The patient has only trace edema at this time and no shortness of breath. Continue to monitor. 2. Endocarditis: MSSA; the patient is afebrile and needs only a few more days of antibiotics. Continue Ancef via peripheral line. 3. Sternal pain: I have discussed with MRI department changing scheduled outpatient imaging to an inpatient study 4. DVT prophylaxis: Full dose anticoagulation as above 5. GI prophylaxis: None The patient is a full code. Time spent on admission orders and patient care approximately 45 minutes  Harrie Foreman, MD 02/09/2016, 7:32 AM

## 2016-02-09 NOTE — Progress Notes (Signed)
  Patient ID: Carmen Riley, female   DOB: 10/16/1980, 35 y.o.   MRN: [id number]  HISTORY: This patient is well known to me. She came back into the hospital because of an upper extremity DVT secondary to her PICC line. She states that she feels much better now than when she first came in the hospital. She has no chest wall tenderness. She has no shortness of breath. She does have some mild discomfort at the left lower aspect of her chest wall with deep inspiration. She had an MRI scan made today.   Vitals:   02/09/16 0753 02/09/16 1346  BP: (!) 96/57 (!) 90/48  Pulse: 83 79  Resp: 17 17  Temp: 98.9 F (37.2 C) 98.3 F (36.8 C)     EXAM:    Resp: Lungs are clear bilaterally With the exception of the left base with there is some dullness..  No respiratory distress, normal effort. Heart:  Regular without murmurs.  There is no tenderness to palpation along her chest wall. There is no erythema along the sternoclavicular joints or the sternum. Abd:  Abdomen is soft, non distended and non tender. No masses are palpable.  There is no rebound and no guarding.  Neurological: Alert and oriented to person, place, and time. Coordination normal.  Skin: Skin is warm and dry. No rash noted. No diaphoretic. No erythema. No pallor.  Psychiatric: Normal mood and affect. Normal behavior. Judgment and thought content normal.    ASSESSMENT: I have independently reviewed the patient's MRI. I discussed her care with Dr. Maryclare BeanArt Hoss in interventional radiology. 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 Our interventional radiologist would be willing to do a bone biopsy for culture and sensitivity.   PLAN:   I've discussed the possibility of performing a bone biopsy with the patient. She is agreeable. She is on oral anticoagulants and those will need to be held prior to any bone biopsy. If it is felt that a bone biopsy is not necessary that I would  recommend continued oral antibiotic therapy if intravenous therapy is not available.    Hulda Marinimothy Cristy Colmenares, MD

## 2016-02-09 NOTE — Progress Notes (Signed)
[facility] [place] [facility]        Carmen BeltsSarah Riley was admitted to the Hospital on 02/08/2016 and she will remain in the hospital until an unknown date. She should be excused from her court date scheduled 02/10/2016.    Call Carmen SaranSital Dempsey Knotek MD with questions.  Carmen Riley M.D on 02/09/2016,at 2:26 PM  [facility] [place] [facility]    Office  [phone]

## 2016-02-09 NOTE — ED Notes (Signed)
Pt updated on delay. Pt verbalizes understanding and denies needs at this time. Pt in no acute distress. Call bell at side.

## 2016-02-09 NOTE — Progress Notes (Signed)
Patient was with her mother and her daughter when chaplains did a joint visit. Patient said that she was in a terrible shape when she was admitted. She is now recovering and she hopes to go home soon.

## 2016-02-09 NOTE — Progress Notes (Signed)
Pharmacy Antibiotic Note  Carmen MedianSarah Elizabeth Riley is a 35 y.o. female admitted on 02/08/2016 with Hx Endocarditis.(with MSSA bacteremia and septic pulm emboli and stern[facility]el[facility]10[facility]). Pharmacy has been consulted for continued Cefazolin dosing.   Admitted with left upper arm swelling and pain. PICC line is malfunctioning. Per patient, antibiotics to end on 02/13/16. (6 weeks IV tx-Dr. Sampson GoonFitzgerald patient).   Plan: Will continue Cefazolin 2 gram IV q8h. Ordered thru 02/13/16.     Height: 5\' 4"  (162.6 cm) Weight: 180 lb (81.6 kg) IBW/kg (Calculated) : 54.7  Temp (24hrs), Avg:98.6 F (37 C), Min:98.2 F (36.8 C), Max:98.8 F (37.1 C)   Recent Labs Lab 02/08/16 2049 02/09/16 0406  WBC 5.9 4.8  CREATININE 0.54  --     Estimated Creatinine Clearance: 103.4 mL/min (by C-G formula based on SCr of 0.54 mg/dL).    No Known Allergies  Antimicrobials this admission: Cefazolin (6 wks tx thru 02/13/16)   >>       >>    Dose adjustments this admission:    Microbiology results: 11/12 BCx: P   UCx:      Sputum:      MRSA PCR:    Thank you for allowing pharmacy to be a part of this patient's care.  Irva Loser A 02/09/2016 7:37 AM

## 2016-02-09 NOTE — Progress Notes (Signed)
[employer] [facility] [place] [facility]   PATIENT NAME: Carmen Riley    MR#:  [id number]  DATE OF BIRTH:  04/11/1980  SUBJECTIVE:   Patient reports that her PICC line is not functioning and is very painful.  REVIEW OF SYSTEMS:    Review of Systems  Constitutional: Negative.  Negative for chills, fever and malaise/fatigue.  HENT: Negative.  Negative for ear discharge, ear pain, hearing loss, nosebleeds and sore throat.   Eyes: Negative.  Negative for blurred vision and pain.  Respiratory: Negative.  Negative for cough, hemoptysis, shortness of breath and wheezing.   Cardiovascular: Negative.  Negative for chest pain, palpitations and leg swelling.  Gastrointestinal: Negative.  Negative for abdominal pain, blood in stool, diarrhea, nausea and vomiting.  Genitourinary: Negative.  Negative for dysuria.  Musculoskeletal: Negative.  Negative for back pain.       Left arm area of PICC line is painful  Skin: Negative.   Neurological: Negative for dizziness, tremors, speech change, focal weakness, seizures and headaches.  Endo/Heme/Allergies: Negative.  Does not bruise/bleed easily.  Psychiatric/Behavioral: Negative.  Negative for depression, hallucinations and suicidal ideas.    Tolerating Diet: yes      DRUG ALLERGIES:  No Known Allergies  VITALS:  Blood pressure (!) 96/57, pulse 83, temperature 98.9 F (37.2 C), temperature source Oral, resp. rate 17, height 5\' 4"  (1.626 m), weight 81.6 kg (180 lb), last menstrual period 01/29/2016, SpO2 94 %.  PHYSICAL EXAMINATION:   Physical Exam  Constitutional: She is oriented to person, place, and time and well-developed, well-nourished, and in no distress. No distress.  HENT:  Head: Normocephalic.  Eyes: No scleral icterus.  Neck: Normal range of motion. Neck supple. No JVD present. No tracheal deviation present.  Cardiovascular: Normal rate, regular rhythm and normal heart sounds.  Exam reveals no gallop and no friction  rub.   No murmur heard. Pulmonary/Chest: Effort normal and breath sounds normal. No respiratory distress. She has no wheezes. She has no rales. She exhibits no tenderness.  Abdominal: Soft. Bowel sounds are normal. She exhibits no distension and no mass. There is no tenderness. There is no rebound and no guarding.  Musculoskeletal: Normal range of motion. She exhibits no edema.  PICC line placed left arm  Neurological: She is alert and oriented to person, place, and time.  Skin: Skin is warm. No rash noted. No erythema.  Psychiatric: Affect and judgment normal.      LABORATORY PANEL:   CBC  Recent Labs Lab 02/09/16 0406  WBC 4.8  HGB 10.2*  HCT 30.7*  PLT 227   ------------------------------------------------------------------------------------------------------------------  Chemistries   Recent Labs Lab 02/08/16 2049  NA 138  K 3.4*  CL 104  CO2 27  GLUCOSE 100*  BUN 7  CREATININE 0.54  CALCIUM 8.8*  AST 46*  ALT 14  ALKPHOS 118  BILITOT 0.3   ------------------------------------------------------------------------------------------------------------------  Cardiac Enzymes No results for input(s): TROPONINI in the last 168 hours. ------------------------------------------------------------------------------------------------------------------  RADIOLOGY:  Dg Chest 1 View  Result Date: 02/08/2016 CLINICAL DATA:  PICC line placement. EXAM: CHEST 1 VIEW COMPARISON:  01/01/2016 FINDINGS: Left PICC line terminates over the expected location the of the innominate vein. Cardiomediastinal silhouette is normal. Mediastinal contours appear intact. There is no evidence of focal airspace consolidation, pleural effusion or pneumothorax. Osseous structures are without acute abnormality. Soft tissues are grossly normal. IMPRESSION: No active disease. Left PICC line terminates over the expected location of the innominate vein. Electronically Signed   By:  Ted Mcalpine  M.D.   On: 02/08/2016 19:52   Mr Elvera Bicker ZO Contrast  Result Date: 02/09/2016 CLINICAL DATA:  Endocarditis. Pain along the sternum. History of osteomyelitis of the sternal manubrium. EXAM: MR STERNUM WITHOUT AND WITH CONTRAST TECHNIQUE: Multiplanar, multisequence MR imaging was performed both before and after administration of intravenous contrast. CONTRAST:  15mL MULTIHANCE GADOBENATE DIMEGLUMINE 529 MG/ML IV SOLN COMPARISON:  01/01/2016 MRI FINDINGS: Despite efforts by the technologist and patient, motion artifact is present on today's exam and could not be eliminated. This reduces exam sensitivity and specificity. Abnormal osseous edema in the medial clavicles bilaterally and in the sternal manubrium, especially superiorly. Adjacent soft tissue edema along the pectoralis musculature and in the substernal mediastinum as shown on image 13/3 and images 5-9 of series 14. I do not see a drainable abscess. Upper normal amount of fluid in both sternoclavicular joints. 9 mm subpectoral lymph node on the left, image 5/14. Internal mammary node on the left measures 4 mm in short axis on image 21/12, formerly 6 mm in short axis. The previous cavitary pulmonary nodules are much less conspicuous on today's exam. Previous abscess of the left pectoralis muscle is no longer seen. Bilateral pleural effusions with enhancement along the left pleural margin and adjacent atelectasis or pneumonia. Suspected loculation of the left pleural effusion. IMPRESSION: 1. There is continued sternal manubrium and possibly medial clavicular osteomyelitis along with surrounding inflammatory phlegmon although findings are less striking than on the prior exam. The left pectoralis abscess is no longer seen and regional inflammatory adenopathy is somewhat smaller. 2. The bilateral small pleural effusions; the left effusion may be loculated and has enhancing margins, empyema not entirely excluded. 3. The previous scattered cavitary lung nodules  are not well seen today. Electronically Signed   By: Gaylyn Rong M.D.   On: 02/09/2016 09:49   US Venous Img Upper Uni Left  Result Date: 02/08/2016 CLINICAL DATA:  Left upper extremity pain and swell for several days. EXAM: Left UPPER EXTREMITY VENOUS DOPPLER ULTRASOUND TECHNIQUE: Gray-scale sonography with graded compression, as well as color Doppler and duplex ultrasound were performed to evaluate the upper extremity deep venous system from the level of the subclavian vein and including the jugular, axillary, basilic, radial, ulnar and upper cephalic vein. Spectral Doppler was utilized to evaluate flow at rest and with distal augmentation maneuvers. COMPARISON:  None. FINDINGS: Contralateral Subclavian Vein: Respiratory phasicity is normal and symmetric with the symptomatic side. No evidence of thrombus. Normal compressibility. Internal Jugular Vein: No evidence of thrombus. Normal compressibility, respiratory phasicity and response to augmentation. Subclavian Vein: No evidence of thrombus. Normal compressibility, respiratory phasicity and response to augmentation. Axillary Vein: No evidence of thrombus. Normal compressibility, respiratory phasicity and response to augmentation. Cephalic Vein: No evidence of thrombus. Normal compressibility, respiratory phasicity and response to augmentation. Basilic Vein: No evidence of thrombus. Normal compressibility, respiratory phasicity and response to augmentation. Brachial Veins: There is thrombus within the left brachial vein surrounding the PICC line. No evidence of thrombus propagating more proximally. The other brachial vein is patent. Radial Veins: No evidence of thrombus. Normal compressibility, respiratory phasicity and response to augmentation. Ulnar Veins: No evidence of thrombus. Normal compressibility, respiratory phasicity and response to augmentation. Venous Reflux:  None visualized. Other Findings:  None visualized. IMPRESSION: Thrombus  surrounding the PICC line within 1 of the left brachial veins. The remainder of the left upper extremity venous drainage is widely patent. Electronically Signed   By: Rosey Bath.D.  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  1. Thrombus surrounding  left brachial veins: PICC line can be removed as per my conversation with Dr. Sampson GoonFitzgerald. Continue Eliquis.  2. MSSA endocarditis: Discussed with Dr. Sampson GoonFitzgerald this morning. Patient may not need a PICC line as the last date is November 15 for IV Ancef.   3. Osteomyelitis of sternum: MRI shows continued sternal manubrium and medial clavicular osteomyelitis with surrounding inflammatory phlegmon which has improved. Dr. Sampson GoonFitzgerald to evaluate patient for ongoing need for IV antibiotics.   Rounded with nursing    Management plans discussed with the patient and she is in agreement.  CODE STATUS: full  TOTAL TIME TAKING CARE OF THIS PATIENT: 35 minutes.     POSSIBLE D/C 2-3 days, DEPENDING ON CLINICAL CONDITION.   Marlea Gambill M.D on 02/09/2016 at 11:22 AM  Between 7am to 6pm - Pager - [phone] After 6pm go to [email or web address] - password [email or web address]  [facility]  Office  [phone]  CC: Primary care physician; [facility]  Note: This dictation was prepared with Dragon dictation along with smaller phrase technology. Any transcriptional errors that result from this process are unintentional.

## 2016-02-09 NOTE — Consult Note (Signed)
Carmen Riley     Reason for Consult: PICC line complication, Sternal osteom    Referring Physician: Bettey Costa Date of Admission:  02/08/2016   Active Problems:   DVT (deep venous thrombosis) (HCC)   HPI: Carmen Riley is a 35 y.o. female on home IV abx for recent MSSA bacteremia from a boil, with subsequent osteomyelitis of sternum.   She was admitted after having swelling in L ue with PICC. This was found due to a brachial vein thrombus and the PICC has been removed. She was coming to the end of her therapy and was due to have MRI which was done today.  Clincally she has no furhter sternal or chest pain. No fevers, chills, sweats. She has tolerated her oral abx very well.  Past Medical History:  Diagnosis Date  . ADHD (attention deficit hyperactivity disorder)   . Endocarditis   . S/P PICC central line placement    Past Surgical History:  Procedure Laterality Date  . CESAREAN SECTION     Social History  Substance Use Topics  . Smoking status: Former Research scientist (life sciences)  . Smokeless tobacco: Never Used  . Alcohol use No   Family History  Problem Relation Age of Onset  . Healthy Mother   . Colon cancer Father   . Asthma Father     Allergies: No Known Allergies  Current antibiotics: Antibiotics Given (last 72 hours)    Date/Time Action Medication Dose Rate   02/09/16 1103 Given   ceFAZolin (ANCEF) IVPB 2g/100 mL premix 2 g 200 mL/hr      MEDICATIONS: . apixaban  10 mg Oral BID  . [START ON 02/15/2016] apixaban  5 mg Oral BID  . buprenorphine  2 mg Sublingual BID  .  ceFAZolin (ANCEF) IV  2 g Intravenous Q8H  . [START ON 02/10/2016] pneumococcal 23 valent vaccine  0.5 mL Intramuscular Tomorrow-1000  . sodium chloride flush  3 mL Intravenous Q12H  . sodium chloride flush  3 mL Intravenous Q12H    Review of Systems - 11 systems reviewed and negative per HPI   OBJECTIVE: Temp:  [98.2 F (36.8 C)-98.9 F (37.2 C)] 98.3 F (36.8 C) (11/13  1346) Pulse Rate:  [79-112] 79 (11/13 1346) Resp:  [16-18] 17 (11/13 1346) BP: (90-152)/(48-89) 90/48 (11/13 1346) SpO2:  [92 %-99 %] 92 % (11/13 1346) Weight:  [81.6 kg (180 lb)] 81.6 kg (180 lb) (11/12 1740) Physical Exam  Constitutional:  oriented to person, place, and time. appears well-developed and well-nourished. No distress.  HENT: [place]/AT, PERRLA, no scleral icterus Mouth/Throat: Oropharynx is clear and moist. No oropharyngeal exudate.  Cardiovascular: Normal rate, regular rhythm and normal heart sounds.  Pulmonary/Chest: Effort normal and breath sounds normal. No respiratory distress.  has no wheezes.  No chest tenderness Neck = supple, no nuchal rigidity Abdominal: Soft. Bowel sounds are normal.  exhibits no distension. There is no tenderness.  Lymphadenopathy: no cervical adenopathy. No axillary adenopathy Neurological: alert and oriented to person, place, and time.  Skin: Skin in LUE with erythema in shape of the dressing for picc Psychiatric: a normal mood and affect.  behavior is normal.  Ext- LUE with significant swelling  LABS: Results for orders placed or performed during the hospital encounter of 02/08/16 (from the past 48 hour(s))  CBC with Differential/Platelet     Status: Abnormal   Collection Time: 02/08/16  8:49 PM  Result Value Ref Range   WBC 5.9 3.6 - 11.0 K/uL   RBC 3.77 (  L) 3.80 - 5.20 MIL/uL   Hemoglobin 10.9 (L) 12.0 - 16.0 g/dL   HCT 32.0 (L) 35.0 - 47.0 %   MCV 84.8 80.0 - 100.0 fL   MCH 28.9 26.0 - 34.0 pg   MCHC 34.1 32.0 - 36.0 g/dL   RDW 13.5 11.5 - 14.5 %   Platelets 258 150 - 440 K/uL   Neutrophils Relative % 41 %   Neutro Abs 2.4 1.4 - 6.5 K/uL   Lymphocytes Relative 34 %   Lymphs Abs 2.0 1.0 - 3.6 K/uL   Monocytes Relative 7 %   Monocytes Absolute 0.4 0.2 - 0.9 K/uL   Eosinophils Relative 17 %   Eosinophils Absolute 1.0 (H) 0 - 0.7 K/uL   Basophils Relative 1 %   Basophils Absolute 0.1 0 - 0.1 K/uL  Sedimentation rate     Status:  Abnormal   Collection Time: 02/08/16  8:49 PM  Result Value Ref Range   Sed Rate 52 (H) 0 - 20 mm/hr  Comprehensive metabolic panel     Status: Abnormal   Collection Time: 02/08/16  8:49 PM  Result Value Ref Range   Sodium 138 135 - 145 mmol/L   Potassium 3.4 (L) 3.5 - 5.1 mmol/L   Chloride 104 101 - 111 mmol/L   CO2 27 22 - 32 mmol/L   Glucose, Bld 100 (H) 65 - 99 mg/dL   BUN 7 6 - 20 mg/dL   Creatinine, Ser 0.54 0.44 - 1.00 mg/dL   Calcium 8.8 (L) 8.9 - 10.3 mg/dL   Total Protein 7.2 6.5 - 8.1 g/dL   Albumin 3.5 3.5 - 5.0 g/dL   AST 46 (H) 15 - 41 U/L   ALT 14 14 - 54 U/L   Alkaline Phosphatase 118 38 - 126 U/L   Total Bilirubin 0.3 0.3 - 1.2 mg/dL   GFR calc non Af Amer >60 >60 mL/min   GFR calc Af Amer >60 >60 mL/min    Comment: (NOTE) The eGFR has been calculated using the CKD EPI equation. This calculation has not been validated in all clinical situations. eGFR's persistently <60 mL/min signify possible Chronic Kidney Riley.    Anion gap 7 5 - 15  Blood culture (routine x 2)     Status: None (Preliminary result)   Collection Time: 02/08/16  8:49 PM  Result Value Ref Range   Specimen Description BLOOD RIGHT HAND    Special Requests BOTTLES DRAWN AEROBIC AND ANAEROBIC 10CC    Culture NO GROWTH < 12 HOURS    Report Status PENDING   APTT     Status: None   Collection Time: 02/08/16  8:49 PM  Result Value Ref Range   aPTT 33 24 - 36 seconds  Protime-INR     Status: None   Collection Time: 02/08/16  8:49 PM  Result Value Ref Range   Prothrombin Time 12.9 11.4 - 15.2 seconds   INR 0.97   Blood culture (routine x 2)     Status: None (Preliminary result)   Collection Time: 02/08/16  8:50 PM  Result Value Ref Range   Specimen Description BLOOD RIGHT ARM    Special Requests BOTTLES DRAWN AEROBIC AND ANAEROBIC 10CC    Culture NO GROWTH < 12 HOURS    Report Status PENDING   CBC     Status: Abnormal   Collection Time: 02/09/16  4:06 AM  Result Value Ref Range   WBC  4.8 3.6 - 11.0 K/uL   RBC 3.61 (L)  3.80 - 5.20 MIL/uL   Hemoglobin 10.2 (L) 12.0 - 16.0 g/dL   HCT 30.7 (L) 35.0 - 47.0 %   MCV 85.1 80.0 - 100.0 fL   MCH 28.3 26.0 - 34.0 pg   MCHC 33.3 32.0 - 36.0 g/dL   RDW 13.1 11.5 - 14.5 %   Platelets 227 150 - 440 K/uL   No components found for: ESR, C REACTIVE PROTEIN MICRO: Recent Results (from the past 720 hour(s))  Blood culture (routine x 2)     Status: None (Preliminary result)   Collection Time: 02/08/16  8:49 PM  Result Value Ref Range Status   Specimen Description BLOOD RIGHT HAND  Final   Special Requests BOTTLES DRAWN AEROBIC AND ANAEROBIC 10CC  Final   Culture NO GROWTH < 12 HOURS  Final   Report Status PENDING  Incomplete  Blood culture (routine x 2)     Status: None (Preliminary result)   Collection Time: 02/08/16  8:50 PM  Result Value Ref Range Status   Specimen Description BLOOD RIGHT ARM  Final   Special Requests BOTTLES DRAWN AEROBIC AND ANAEROBIC 10CC  Final   Culture NO GROWTH < 12 HOURS  Final   Report Status PENDING  Incomplete    IMAGING: Dg Chest 1 View  Result Date: 02/08/2016 CLINICAL DATA:  PICC line placement. EXAM: CHEST 1 VIEW COMPARISON:  01/01/2016 FINDINGS: Left PICC line terminates over the expected location the of the innominate vein. Cardiomediastinal silhouette is normal. Mediastinal contours appear intact. There is no evidence of focal airspace consolidation, pleural effusion or pneumothorax. Osseous structures are without acute abnormality. Soft tissues are grossly normal. IMPRESSION: No active Riley. Left PICC line terminates over the expected location of the innominate vein. Electronically Signed   By: Fidela Salisbury M.D.   On: 02/08/2016 19:52   Mr Larose Kells EY Contrast  Result Date: 02/09/2016 CLINICAL DATA:  Endocarditis. Pain along the sternum. History of osteomyelitis of the sternal manubrium. EXAM: MR STERNUM WITHOUT AND WITH CONTRAST TECHNIQUE: Multiplanar, multisequence MR imaging  was performed both before and after administration of intravenous contrast. CONTRAST:  62m MULTIHANCE GADOBENATE DIMEGLUMINE 529 MG/ML IV SOLN COMPARISON:  01/01/2016 MRI FINDINGS: Despite efforts by the technologist and patient, motion artifact is present on today's exam and could not be eliminated. This reduces exam sensitivity and specificity. Abnormal osseous edema in the medial clavicles bilaterally and in the sternal manubrium, especially superiorly. Adjacent soft tissue edema along the pectoralis musculature and in the substernal mediastinum as shown on image 13/3 and images 5-9 of series 14. I do not see a drainable abscess. Upper normal amount of fluid in both sternoclavicular joints. 9 mm subpectoral lymph node on the left, image 5/14. Internal mammary node on the left measures 4 mm in short axis on image 21/12, formerly 6 mm in short axis. The previous cavitary pulmonary nodules are much less conspicuous on today's exam. Previous abscess of the left pectoralis muscle is no longer seen. Bilateral pleural effusions with enhancement along the left pleural margin and adjacent atelectasis or pneumonia. Suspected loculation of the left pleural effusion. IMPRESSION: 1. There is continued sternal manubrium and possibly medial clavicular osteomyelitis along with surrounding inflammatory phlegmon although findings are less striking than on the prior exam. The left pectoralis abscess is no longer seen and regional inflammatory adenopathy is somewhat smaller. 2. The bilateral small pleural effusions; the left effusion may be loculated and has enhancing margins, empyema not entirely excluded. 3. The previous scattered cavitary lung  nodules are not well seen today. Electronically Signed   By: Van Clines M.D.   On: 02/09/2016 09:49   US Venous Img Upper Uni Left  Result Date: 02/08/2016 CLINICAL DATA:  Left upper extremity pain and swell for several days. EXAM: Left UPPER EXTREMITY VENOUS DOPPLER  ULTRASOUND TECHNIQUE: Gray-scale sonography with graded compression, as well as color Doppler and duplex ultrasound were performed to evaluate the upper extremity deep venous system from the level of the subclavian vein and including the jugular, axillary, basilic, radial, ulnar and upper cephalic vein. Spectral Doppler was utilized to evaluate flow at rest and with distal augmentation maneuvers. COMPARISON:  None. FINDINGS: Contralateral Subclavian Vein: Respiratory phasicity is normal and symmetric with the symptomatic side. No evidence of thrombus. Normal compressibility. Internal Jugular Vein: No evidence of thrombus. Normal compressibility, respiratory phasicity and response to augmentation. Subclavian Vein: No evidence of thrombus. Normal compressibility, respiratory phasicity and response to augmentation. Axillary Vein: No evidence of thrombus. Normal compressibility, respiratory phasicity and response to augmentation. Cephalic Vein: No evidence of thrombus. Normal compressibility, respiratory phasicity and response to augmentation. Basilic Vein: No evidence of thrombus. Normal compressibility, respiratory phasicity and response to augmentation. Brachial Veins: There is thrombus within the left brachial vein surrounding the PICC line. No evidence of thrombus propagating more proximally. The other brachial vein is patent. Radial Veins: No evidence of thrombus. Normal compressibility, respiratory phasicity and response to augmentation. Ulnar Veins: No evidence of thrombus. Normal compressibility, respiratory phasicity and response to augmentation. Venous Reflux:  None visualized. Other Findings:  None visualized. IMPRESSION: Thrombus surrounding the PICC line within 1 of the left brachial veins. The remainder of the left upper extremity venous drainage is widely patent. Electronically Signed   By: Andreas Newport M.D.   On: 02/08/2016 22:57    Assessment:   Carmen Riley is a 35 y.o. female with  recent MSSA bacteremia on IV ancef finishing a  6 week course 10/16. Clinically she is much improved but imaging seems to lag behind.  Her ESR has trended down wbc nml  Over all she seems stable.  I would not pursue a bone bxp as unlikely to add much. Given risk of repeat PICC line, I think it would be wise to try her on oral abx and follow closely. If worsening can replace picc line as otpt.   Recommendations Would not rec bone bxp At dc change to oral keflex 500 mg QID- this has reasonable bone penetration Can add rifampin 600 mg daily as well for a few week Plan on a 4 week course following clinically and following labs I can see in 2 weeks   Thank you very much for allowing me to participate in the care of this patient. Please call with questions.   Cheral Marker. Ola Spurr, MD

## 2016-02-10 DIAGNOSIS — I82622 Acute embolism and thrombosis of deep veins of left upper extremity: Secondary | ICD-10-CM | POA: Diagnosis not present

## 2016-02-10 LAB — BASIC METABOLIC PANEL
Anion gap: 8 (ref 5–15)
BUN: 9 mg/dL (ref 6–20)
CHLORIDE: 102 mmol/L (ref 101–111)
CO2: 28 mmol/L (ref 22–32)
CREATININE: 0.59 mg/dL (ref 0.44–1.00)
Calcium: 8.7 mg/dL — ABNORMAL LOW (ref 8.9–10.3)
GFR calc non Af Amer: >60 mL/min (ref >60)
Glucose, Bld: 102 mg/dL — ABNORMAL HIGH (ref 65–99)
POTASSIUM: 4.1 mmol/L (ref 3.5–5.1)
Sodium: 138 mmol/L (ref 135–145)

## 2016-02-10 LAB — CBC
HEMATOCRIT: 33.3 % — AB (ref 35.0–47.0)
HEMOGLOBIN: 11.4 g/dL — AB (ref 12.0–16.0)
MCH: 28.7 pg (ref 26.0–34.0)
MCHC: 34.2 g/dL (ref 32.0–36.0)
MCV: 84 fL (ref 80.0–100.0)
Platelets: 249 10*3/uL (ref 150–440)
RBC: 3.96 MIL/uL (ref 3.80–5.20)
RDW: 13.3 % (ref 11.5–14.5)
WBC: 5 10*3/uL (ref 3.6–11.0)

## 2016-02-10 MED ORDER — PROBIOTIC 250 MG PO CAPS: 250.0000 mg | ORAL_CAPSULE | Freq: Two times a day (BID) | ORAL | 0 refills | Status: AC

## 2016-02-10 MED ORDER — CEPHALEXIN 500 MG PO CAPS
500.0000 mg | ORAL_CAPSULE | Freq: Four times a day (QID) | ORAL | 0 refills | Status: AC
Start: 2016-02-10 — End: 2016-03-11

## 2016-02-10 MED ORDER — CEPHALEXIN 500 MG PO CAPS
500.0000 mg | ORAL_CAPSULE | Freq: Four times a day (QID) | ORAL | Status: DC
  Administered 2016-02-10: 500 mg via ORAL
  Filled 2016-02-10: qty 1

## 2016-02-10 MED ORDER — APIXABAN 5 MG PO TABS: 10.0000 mg | ORAL_TABLET | Freq: Two times a day (BID) | ORAL | 0 refills | Status: AC

## 2016-02-10 MED ORDER — RIFAMPIN 300 MG PO CAPS: 600.0000 mg | ORAL_CAPSULE | Freq: Every day | ORAL | Status: DC

## 2016-02-10 MED ORDER — APIXABAN 5 MG PO TABS: 5.0000 mg | ORAL_TABLET | Freq: Two times a day (BID) | ORAL | 0 refills | Status: AC

## 2016-02-10 NOTE — Discharge Summary (Signed)
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 ADHD (attention deficit hyperactivity disorder)   . Endocarditis   . 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  1. Thrombus surrounding  left brachial veins: PICC line was removed and for now she does not a new PICC line. Continue Eliquis for 3 months.  2. MSSA endocarditis: She needs PO KEFLEX 500 mg PO QID for a prolonged period as per Dr Sampson Goon.  He did not recommend a bone biopsy as it is unlikely to add much to current treatment. We could not use rifampin as it is contraindicated with Eliquis    3. Osteomyelitis of sternum: MRI shows continued sternal manubrium and medial clavicular osteomyelitis with surrounding inflammatory phlegmon which has improved. Dr. Thelma Barge has recommended possible bone biopsy however it is not thought that this would add much to her current treatment plan. She will continue on Keflex and will be followed closely. If she is worsening then she may need evaluation for PICC line as an outpatient.   DISCHARGE CONDITIONS AND DIET:  Stable For discharge  regular diet  CONSULTS  OBTAINED:  Treatment Team:  Mick Sell, MD  DRUG ALLERGIES:  No Known Allergies  DISCHARGE MEDICATIONS:   Current Discharge Medication List    START taking these medications   Details  !! apixaban (ELIQUIS) 5 MG TABS tablet Take 2 tablets (10 mg total) by mouth 2 (two) times daily. Qty: 16 tablet, Refills: 0    !! apixaban (ELIQUIS) 5 MG TABS tablet Take 1 tablet (5 mg total) by mouth 2 (two) times daily. Qty: 60 tablet, Refills: 0    cephALEXin (KEFLEX) 500 MG capsule Take 1 capsule (500 mg total) by mouth every 6 (six) hours. Qty: 120 capsule, Refills: 0    Saccharomyces boulardii (PROBIOTIC) 250 MG CAPS Take 250 mg by mouth 2 (two) times daily. Qty: 60 capsule, Refills: 0     !! - Potential duplicate medications found. Please discuss with provider.    CONTINUE these medications which have NOT CHANGED   Details  acetaminophen (TYLENOL) 500 MG chewable tablet Chew 500 mg by mouth every 6 (six) hours as needed for pain.    buprenorphine-naloxone (SUBOXONE) 2-0.5 MG SUBL SL tablet Place 1 tablet under the tongue 2 (two) times daily.    amphetamine-dextroamphetamine (ADDERALL) 20 MG tablet Take 20 mg by mouth 3 (three) times daily.      STOP taking these medications     ceFAZolin (ANCEF) 2-4 GM/100ML-% IVPB      ibuprofen (ADVIL,MOTRIN) 200 MG tablet  Today   CHIEF COMPLAINT:  Patient is doing well no acute issues overnight.   VITAL SIGNS:  Blood pressure 100/60, pulse 74, temperature 98.5 F (36.9 C), temperature source Oral, resp. rate 18, height 5\' 4"  (1.626 m), weight 81.6 kg (180 lb), last menstrual period 01/29/2016, SpO2 98 %.   REVIEW OF SYSTEMS:  Review of Systems  Constitutional: Negative.  Negative for chills, fever and malaise/fatigue.  HENT: Negative.  Negative for ear discharge, ear pain, hearing loss, nosebleeds and sore throat.   Eyes: Negative.  Negative for blurred vision and pain.  Respiratory: Negative.   Negative for cough, hemoptysis, shortness of breath and wheezing.   Cardiovascular: Negative.  Negative for chest pain, palpitations and leg swelling.  Gastrointestinal: Negative.  Negative for abdominal pain, blood in stool, diarrhea, nausea and vomiting.  Genitourinary: Negative.  Negative for dysuria.  Musculoskeletal: Negative.  Negative for back pain.  Skin: Negative.   Neurological: Negative for dizziness, tremors, speech change, focal weakness, seizures and headaches.  Endo/Heme/Allergies: Negative.  Does not bruise/bleed easily.  Psychiatric/Behavioral: Negative.  Negative for depression, hallucinations and suicidal ideas.     PHYSICAL EXAMINATION:  GENERAL:  35 y.o.-year-old patient lying in the bed with no acute distress.  NECK:  Supple, no jugular venous distention. No thyroid enlargement, no tenderness.  LUNGS: Normal breath sounds bilaterally, no wheezing, rales,rhonchi  No use of accessory muscles of respiration.  CARDIOVASCULAR: S1, S2 normal. No murmurs, rubs, or gallops.  ABDOMEN: Soft, non-tender, non-distended. Bowel sounds present. No organomegaly or mass.  EXTREMITIES: No pedal edema, cyanosis, or clubbing.  PSYCHIATRIC: The patient is alert and oriented x 3.  SKIN: Rash from the tape from the PICC line  DATA REVIEW:   CBC  Recent Labs Lab 02/10/16 0433  WBC 5.0  HGB 11.4*  HCT 33.3*  PLT 249    Chemistries   Recent Labs Lab 02/08/16 2049 02/10/16 0433  NA 138 138  K 3.4* 4.1  CL 104 102  CO2 27 28  GLUCOSE 100* 102*  BUN 7 9  CREATININE 0.54 0.59  CALCIUM 8.8* 8.7*  AST 46*  --   ALT 14  --   ALKPHOS 118  --   BILITOT 0.3  --     Cardiac Enzymes No results for input(s): TROPONINI in the last 168 hours.  Microbiology Results  @MICRORSLT48 @  RADIOLOGY:  Dg Chest 1 View  Result Date: 02/08/2016 CLINICAL DATA:  PICC line placement. EXAM: CHEST 1 VIEW COMPARISON:  01/01/2016 FINDINGS: Left PICC line terminates over the expected  location the of the innominate vein. Cardiomediastinal silhouette is normal. Mediastinal contours appear intact. There is no evidence of focal airspace consolidation, pleural effusion or pneumothorax. Osseous structures are without acute abnormality. Soft tissues are grossly normal. IMPRESSION: No active disease. Left PICC line terminates over the expected location of the innominate vein. Electronically Signed   By: Ted Mcalpineobrinka  Dimitrova M.D.   On: 02/08/2016 19:52   Mr Elvera BickerSternum W RUWo Contrast  Result Date: 02/09/2016 CLINICAL DATA:  Endocarditis. Pain along the sternum. History of osteomyelitis of the sternal manubrium. EXAM: MR STERNUM WITHOUT AND WITH CONTRAST TECHNIQUE: Multiplanar, multisequence MR imaging was performed both before and after administration of intravenous contrast. CONTRAST:  15mL MULTIHANCE GADOBENATE DIMEGLUMINE 529 MG/ML IV SOLN COMPARISON:  01/01/2016 MRI FINDINGS: Despite efforts by the technologist and patient, motion artifact is present on today's exam and could not be eliminated. This reduces exam sensitivity and specificity. Abnormal osseous edema in the medial clavicles bilaterally  and in the sternal manubrium, especially superiorly. Adjacent soft tissue edema along the pectoralis musculature and in the substernal mediastinum as shown on image 13/3 and images 5-9 of series 14. I do not see a drainable abscess. Upper normal amount of fluid in both sternoclavicular joints. 9 mm subpectoral lymph node on the left, image 5/14. Internal mammary node on the left measures 4 mm in short axis on image 21/12, formerly 6 mm in short axis. The previous cavitary pulmonary nodules are much less conspicuous on today's exam. Previous abscess of the left pectoralis muscle is no longer seen. Bilateral pleural effusions with enhancement along the left pleural margin and adjacent atelectasis or pneumonia. Suspected loculation of the left pleural effusion. IMPRESSION: 1. There is continued sternal  manubrium and possibly medial clavicular osteomyelitis along with surrounding inflammatory phlegmon although findings are less striking than on the prior exam. The left pectoralis abscess is no longer seen and regional inflammatory adenopathy is somewhat smaller. 2. The bilateral small pleural effusions; the left effusion may be loculated and has enhancing margins, empyema not entirely excluded. 3. The previous scattered cavitary lung nodules are not well seen today. Electronically Signed   By: Gaylyn Rong M.D.   On: 02/09/2016 09:49   US Venous Img Upper Uni Left  Result Date: 02/08/2016 CLINICAL DATA:  Left upper extremity pain and swell for several days. EXAM: Left UPPER EXTREMITY VENOUS DOPPLER ULTRASOUND TECHNIQUE: Gray-scale sonography with graded compression, as well as color Doppler and duplex ultrasound were performed to evaluate the upper extremity deep venous system from the level of the subclavian vein and including the jugular, axillary, basilic, radial, ulnar and upper cephalic vein. Spectral Doppler was utilized to evaluate flow at rest and with distal augmentation maneuvers. COMPARISON:  None. FINDINGS: Contralateral Subclavian Vein: Respiratory phasicity is normal and symmetric with the symptomatic side. No evidence of thrombus. Normal compressibility. Internal Jugular Vein: No evidence of thrombus. Normal compressibility, respiratory phasicity and response to augmentation. Subclavian Vein: No evidence of thrombus. Normal compressibility, respiratory phasicity and response to augmentation. Axillary Vein: No evidence of thrombus. Normal compressibility, respiratory phasicity and response to augmentation. Cephalic Vein: No evidence of thrombus. Normal compressibility, respiratory phasicity and response to augmentation. Basilic Vein: No evidence of thrombus. Normal compressibility, respiratory phasicity and response to augmentation. Brachial Veins: There is thrombus within the left brachial  vein surrounding the PICC line. No evidence of thrombus propagating more proximally. The other brachial vein is patent. Radial Veins: No evidence of thrombus. Normal compressibility, respiratory phasicity and response to augmentation. Ulnar Veins: No evidence of thrombus. Normal compressibility, respiratory phasicity and response to augmentation. Venous Reflux:  None visualized. Other Findings:  None visualized. IMPRESSION: Thrombus surrounding the PICC line within 1 of the left brachial veins. The remainder of the left upper extremity venous drainage is widely patent. Electronically Signed   By: Ellery Plunk M.D.   On: 02/08/2016 22:57      Management plans discussed with the patient and she is in agreement. Stable for discharge home  Patient should follow up with pcp Dr. Sampson Goon in 10 days  CODE STATUS:     Code Status Orders        Start     Ordered   02/09/16 0238  Full code  Continuous     02/09/16 0237    Code Status History    Date Active Date Inactive Code Status Order ID Comments User Context   12/29/2015  1:42 PM 01/02/2016 10:08 PM Full Code  [id number]  Alford Highlandichard Wieting, MD ED      TOTAL TIME TAKING CARE OF THIS PATIENT: 37 minutes.    Note: This dictation was prepared with Dragon dictation along with smaller phrase technology. Any transcriptional errors that result from this process are unintentional.  Makyia Erxleben M.D on 02/10/2016 at 10:30 AM  Between 7am to 6pm - Pager - [phone] After 6pm go to [email or web address] - Social research officer, governmentpassword EPAS [email or web address]  [facility]  Office  [phone]  CC: Primary care physician; [facility]

## 2016-02-10 NOTE — Progress Notes (Signed)
CH rounding the unit visited Pt. Pt was lying in the bed when the Ch visited. Pt told Ch, she had been visited by justice and Irving Burtonmily this morning and appreciated their visited. CH told Pt, he would convey that message to the chaplain who had visited Carmen SagoSarah. Pt also stated the the chaplains prayers were answered as she was to be discharged today.    02/10/16 1500  Clinical Encounter Type  Visited With Patient  Visit Type Initial  Spiritual Encounters  Spiritual Needs Prayer

## 2016-02-10 NOTE — Progress Notes (Signed)
  Patient ID: Carmen MedianSarah Elizabeth Riley, female   DOB: 03/22/1981, 35 y.o.   MRN: [id number]  HISTORY: Feels well.  No chest pain.   Vitals:   02/10/16 0410 02/10/16 0800  BP: (!) 102/54 100/60  Pulse: 71 74  Resp:  18  Temp:  98.5 F (36.9 C)     EXAM:    Resp: Lungs are clear bilaterally.  No respiratory distress, normal effort. Heart:  Regular without murmurs Abd:  Abdomen is soft, non distended and non tender. No masses are palpable.  There is no rebound and no guarding.  Neurological: Alert and oriented to person, place, and time. Coordination normal.  Skin: Skin is warm and dry. No rash noted. No diaphoretic. No erythema. No pallor. No tenderrness to sternal palpation. Psychiatric: Normal mood and affect. Normal behavior. Judgment and thought content normal.    ASSESSMENT: Sternal osteomyelitis   PLAN:   No need for sternal biopsy at this point per patient history Would continue oral antibiotics. Please call if I can be of any assistance.    Hulda Marinimothy Leilanie Rauda, MD

## 2016-02-10 NOTE — Care Management (Addendum)
Advanced notified of dicharge. IV antibiotics have stopped but RNCM feels patient would benefit from 2-3 SN visit to ensure adequate healing.

## 2016-02-13 LAB — CULTURE, BLOOD (ROUTINE X 2)
CULTURE: NO GROWTH
CULTURE: NO GROWTH

## 2016-03-19 LAB — CULTURE, BLOOD (ROUTINE X 2)
CULTURE: NO GROWTH
CULTURE: NO GROWTH

## 2016-04-02 ENCOUNTER — Emergency Department
Admission: EM | Admit: 2016-04-02 | Discharge: 2016-04-02 | Disposition: A | Discharge status: Home / Self Care | Payer: Medicaid Other | Attending: Emergency Medicine | Admitting: Emergency Medicine

## 2016-04-02 DIAGNOSIS — Z79899 Other long term (current) drug therapy: Secondary | ICD-10-CM | POA: Diagnosis not present

## 2016-04-02 DIAGNOSIS — Z87891 Personal history of nicotine dependence: Secondary | ICD-10-CM | POA: Diagnosis not present

## 2016-04-02 DIAGNOSIS — F909 Attention-deficit hyperactivity disorder, unspecified type: Secondary | ICD-10-CM | POA: Insufficient documentation

## 2016-04-02 DIAGNOSIS — R0602 Shortness of breath: Secondary | ICD-10-CM | POA: Diagnosis present

## 2016-04-02 DIAGNOSIS — Z5321 Procedure and treatment not carried out due to patient leaving prior to being seen by health care provider: Secondary | ICD-10-CM | POA: Insufficient documentation

## 2016-04-02 NOTE — ED Notes (Signed)
Upon entering triage room pt states her babysitter had an emergency and she has to leave

## 2016-04-10 ENCOUNTER — Encounter: Payer: Self-pay | Admitting: Emergency Medicine

## 2016-04-10 ENCOUNTER — Emergency Department: Payer: Medicaid Other

## 2016-04-10 ENCOUNTER — Emergency Department
Admission: EM | Admit: 2016-04-10 | Discharge: 2016-04-10 | Disposition: A | Discharge status: Home / Self Care | Payer: Medicaid Other | Attending: Emergency Medicine | Admitting: Emergency Medicine

## 2016-04-10 DIAGNOSIS — F909 Attention-deficit hyperactivity disorder, unspecified type: Secondary | ICD-10-CM | POA: Diagnosis not present

## 2016-04-10 DIAGNOSIS — J020 Streptococcal pharyngitis: Secondary | ICD-10-CM | POA: Diagnosis not present

## 2016-04-10 DIAGNOSIS — Z79899 Other long term (current) drug therapy: Secondary | ICD-10-CM | POA: Insufficient documentation

## 2016-04-10 DIAGNOSIS — Z87891 Personal history of nicotine dependence: Secondary | ICD-10-CM | POA: Diagnosis not present

## 2016-04-10 DIAGNOSIS — R509 Fever, unspecified: Secondary | ICD-10-CM

## 2016-04-10 LAB — COMPREHENSIVE METABOLIC PANEL
ALK PHOS: 94 U/L (ref 38–126)
ALT: 21 U/L (ref 14–54)
AST: 25 U/L (ref 15–41)
Albumin: 4.3 g/dL (ref 3.5–5.0)
Anion gap: 9 (ref 5–15)
BUN: 9 mg/dL (ref 6–20)
CHLORIDE: 100 mmol/L — AB (ref 101–111)
CO2: 26 mmol/L (ref 22–32)
CREATININE: 0.75 mg/dL (ref 0.44–1.00)
Calcium: 9.1 mg/dL (ref 8.9–10.3)
GFR calc Af Amer: >60 mL/min (ref >60)
Glucose, Bld: 106 mg/dL — ABNORMAL HIGH (ref 65–99)
Potassium: 3.3 mmol/L — ABNORMAL LOW (ref 3.5–5.1)
Sodium: 135 mmol/L (ref 135–145)
Total Bilirubin: 0.8 mg/dL (ref 0.3–1.2)
Total Protein: 8.4 g/dL — ABNORMAL HIGH (ref 6.5–8.1)

## 2016-04-10 LAB — INFLUENZA PANEL BY PCR (TYPE A & B)
INFLAPCR: NEGATIVE
Influenza B By PCR: NEGATIVE

## 2016-04-10 LAB — CBC
HEMATOCRIT: 40.9 % (ref 35.0–47.0)
HEMOGLOBIN: 13.6 g/dL (ref 12.0–16.0)
MCH: 26.9 pg (ref 26.0–34.0)
MCHC: 33.2 g/dL (ref 32.0–36.0)
MCV: 81.2 fL (ref 80.0–100.0)
PLATELETS: 245 10*3/uL (ref 150–440)
RBC: 5.04 MIL/uL (ref 3.80–5.20)
RDW: 14.1 % (ref 11.5–14.5)
WBC: 15.9 10*3/uL — ABNORMAL HIGH (ref 3.6–11.0)

## 2016-04-10 LAB — POCT RAPID STREP A: Streptococcus, Group A Screen (Direct): POSITIVE — AB

## 2016-04-10 LAB — SEDIMENTATION RATE: Sed Rate: 25 mm/hr — ABNORMAL HIGH (ref 0–20)

## 2016-04-10 MED ORDER — AMOXICILLIN-POT CLAVULANATE 875-125 MG PO TABS
1.0000 | ORAL_TABLET | Freq: Once | ORAL | Status: AC
  Administered 2016-04-10: 1 via ORAL
  Filled 2016-04-10: qty 1

## 2016-04-10 MED ORDER — ACETAMINOPHEN 325 MG PO TABS
650.0000 mg | ORAL_TABLET | Freq: Once | ORAL | Status: AC
  Administered 2016-04-10: 650 mg via ORAL

## 2016-04-10 MED ORDER — SODIUM CHLORIDE 0.9 % IV BOLUS (SEPSIS)
1000.0000 mL | Freq: Once | INTRAVENOUS | Status: AC
  Administered 2016-04-10: 1000 mL via INTRAVENOUS

## 2016-04-10 MED ORDER — IBUPROFEN 600 MG PO TABS
600.0000 mg | ORAL_TABLET | Freq: Once | ORAL | Status: AC
  Administered 2016-04-10: 600 mg via ORAL
  Filled 2016-04-10: qty 1

## 2016-04-10 MED ORDER — ACETAMINOPHEN 325 MG PO TABS
ORAL_TABLET | ORAL | Status: AC
  Administered 2016-04-10: 650 mg via ORAL
  Filled 2016-04-10: qty 2

## 2016-04-10 MED ORDER — AMOXICILLIN-POT CLAVULANATE 875-125 MG PO TABS: 1.0000 | ORAL_TABLET | Freq: Two times a day (BID) | ORAL | 0 refills | Status: AC

## 2016-04-10 NOTE — ED Triage Notes (Addendum)
Pt to ed with c/o fever, sore throat, earache that started yesterday.  Pt denies chest pain.  Denies cough.  Pt just finished abx for endocarditis about 1 weeks ago.

## 2016-04-10 NOTE — ED Notes (Signed)

## 2016-04-10 NOTE — ED Provider Notes (Signed)
[facility] Emergency Department Provider Note  ____________________________________________   First MD Initiated Contact with Patient 04/10/16 1509     (approximate)  I have reviewed the triage vital signs and the nursing notes.   HISTORY  Chief Complaint Fever   HPI Carmen Riley is a 36 y.o. female with a history of endocarditis from an abscess as well as DVT was present to emergency department today with 1 day of fever as well as sore throat and body aches. She denies any cough. Denies any chest pain. Denies any nausea or vomiting.Patient had the endocarditis diagnosed recently and finished her Keflex about one half weeks ago. She also says that her daughter has been sick with similar illness. She denies any IV drug use.   Past Medical History:  Diagnosis Date  . ADHD (attention deficit hyperactivity disorder)   . Endocarditis   . S/P PICC central line placement     Patient Active Problem List   Diagnosis Date Noted  . DVT (deep venous thrombosis) ([place]) 02/09/2016  . Acute deep vein thrombosis (DVT) of brachial vein of left upper extremity ([place])   . Sternal pain   . Pressure injury of skin 12/30/2015  . Sepsis ([place]) 12/29/2015    Past Surgical History:  Procedure Laterality Date  . CESAREAN SECTION      Prior to Admission medications   Medication Sig Start Date End Date Taking? Authorizing Provider  acetaminophen (TYLENOL) 500 MG chewable tablet Chew 500 mg by mouth every 6 (six) hours as needed for pain.    Historical Provider, MD  amphetamine-dextroamphetamine (ADDERALL) 20 MG tablet Take 20 mg by mouth 3 (three) times daily.    Historical Provider, MD  apixaban (ELIQUIS) 5 MG TABS tablet Take 2 tablets (10 mg total) by mouth 2 (two) times daily. 02/10/16 02/14/16  Bettey Costa, MD  apixaban (ELIQUIS) 5 MG TABS tablet Take 1 tablet (5 mg total) by mouth 2 (two) times daily. 02/15/16   Sital Mody, MD  buprenorphine-naloxone  (SUBOXONE) 2-0.5 MG SUBL SL tablet Place 1 tablet under the tongue 2 (two) times daily.    Historical Provider, MD  Saccharomyces boulardii (PROBIOTIC) 250 MG CAPS Take 250 mg by mouth 2 (two) times daily. 02/10/16   Bettey Costa, MD    Allergies Patient has no known allergies.  Family History  Problem Relation Age of Onset  . Healthy Mother   . Colon cancer Father   . Asthma Father     Social History Social History  Substance Use Topics  . Smoking status: Former Research scientist (life sciences)  . Smokeless tobacco: Never Used  . Alcohol use No    Review of Systems Constitutional: As above Eyes: No visual changes. ENT: As above Cardiovascular: Denies chest pain. Respiratory: Denies shortness of breath. Gastrointestinal: No abdominal pain.  No nausea, no vomiting.  No diarrhea.  No constipation. Genitourinary: Negative for dysuria. Musculoskeletal: Negative for back pain. Skin: Negative for rash. Neurological: Negative for headaches, focal weakness or numbness.  10-point ROS otherwise negative.  ____________________________________________   PHYSICAL EXAM:  VITAL SIGNS: ED Triage Vitals [04/10/16 1420]  Enc Vitals Group     BP (!) 113/59     Pulse Rate (!) 134     Resp 20     Temp (!) 102.3 F (39.1 C)     Temp Source Oral     SpO2 99 %     Weight 180 lb (81.6 kg)     Height  Head Circumference      Peak Flow      Pain Score 7     Pain Loc      Pain Edu?      Excl. in [place]?     Constitutional: Alert and oriented. Well appearing and in no acute distress. Eyes: Conjunctivae are normal. PERRL. EOMI. Head: Atraumatic. Nose: No congestion/rhinnorhea. Mouth/Throat: Mucous membranes are moist. Erythematous pharynx with pus overlying the right tonsil. Also with tender anterior cervical lymphadenopathy. Neck: No stridor.   Cardiovascular: Heart, regular rhythm. Grossly normal heart sounds.   Respiratory: Normal respiratory effort.  No retractions. Lungs CTAB. Gastrointestinal: Soft  and nontender. No distention.  Musculoskeletal: No lower extremity tenderness nor edema.  No joint effusions. No tenderness over the sternum with the patient has been diagnosed with osteomyelitis. Neurologic:  Normal speech and language. No gross focal neurologic deficits are appreciated. Skin:  Skin is warm, dry and intact. No rash noted. Psychiatric: Mood and affect are normal. Speech and behavior are normal.  ____________________________________________   LABS (all labs ordered are listed, but only abnormal results are displayed)  Labs Reviewed  CBC - Abnormal; Notable for the following:       Result Value   WBC 15.9 (*)    All other components within normal limits  COMPREHENSIVE METABOLIC PANEL - Abnormal; Notable for the following:    Potassium 3.3 (*)    Chloride 100 (*)    Glucose, Bld 106 (*)    Total Protein 8.4 (*)    All other components within normal limits  SEDIMENTATION RATE - Abnormal; Notable for the following:    Sed Rate 25 (*)    All other components within normal limits  POCT RAPID STREP A - Abnormal; Notable for the following:    Streptococcus, Group A Screen (Direct) POSITIVE (*)    All other components within normal limits  CULTURE, BLOOD (ROUTINE X 2)  CULTURE, BLOOD (ROUTINE X 2)  INFLUENZA PANEL BY PCR (TYPE A & B, H1N1)   ____________________________________________  EKG  ED ECG REPORT I, Doran Stabler, the attending physician, personally viewed and interpreted this ECG.   Date: 04/10/2016  EKG Time: 1511  Rate: 116  Rhythm: sinus tachycardia  Axis: normal  Intervals:none  ST&T Change: no st segment elevation or depression. No abnormal T-wave inversion.  ____________________________________________  RADIOLOGY   ____________________________________________   PROCEDURES  Procedure(s) performed:   Procedures  Critical Care performed:   ____________________________________________   INITIAL IMPRESSION / ASSESSMENT AND PLAN  / ED COURSE  Pertinent labs & imaging results that were available during my care of the patient were reviewed by me and considered in my medical decision making (see chart for details).    Clinical Course   ----------------------------------------- 4:59 PM on 04/10/2016 -----------------------------------------  I discussed the case with Dr. Drucilla Schmidt who is on call for infectious disease today. He says that the patient should have an ESR and cultures drawn but would probably be okay to go home on Augmentin for the strep throat. He recommends follow-up with infectious disease. Patient has classic findings of strep throat as well as a positive strep test.  Feel that the strep as the primary diagnosis today and not a comp location of the endocarditis.  ----------------------------------------- 6:08 PM on 04/10/2016 -----------------------------------------  Patient now with a heart rate of 97 bpm. Blood pressures now with systolic over 413. Less pressure 104/57. The patient says that this is her baseline blood pressure. She'll be discharged home.  She noticed return for any worsening or concerning symptoms. She'll be discharged with Augmentin and will follow-up with Dr. Ola Spurr. She is understanding of the plan and willing to comply. ____________________________________________   FINAL CLINICAL IMPRESSION(S) / ED DIAGNOSES  Strep throat. Fever.    NEW MEDICATIONS STARTED DURING THIS VISIT:  New Prescriptions   No medications on file     Note:  This document was prepared using Dragon voice recognition software and may include unintentional dictation errors.    Orbie Pyo, MD 04/10/16 [phone]

## 2016-04-15 LAB — CULTURE, BLOOD (ROUTINE X 2)
CULTURE: NO GROWTH
Culture: NO GROWTH
